# Patient Record
Sex: Male | Born: 1990 | Race: White | Hispanic: No | Marital: Married | Smoking: Former smoker
Health system: Southern US, Community
[De-identification: ages and names within clinical notes are randomized; demographics above are authoritative.]

## PROBLEM LIST (undated history)

## (undated) DIAGNOSIS — T7840XA Allergy, unspecified, initial encounter: Secondary | ICD-10-CM

## (undated) DIAGNOSIS — G43909 Migraine, unspecified, not intractable, without status migrainosus: Secondary | ICD-10-CM

## (undated) DIAGNOSIS — F32A Depression, unspecified: Secondary | ICD-10-CM

## (undated) DIAGNOSIS — J45909 Unspecified asthma, uncomplicated: Secondary | ICD-10-CM

## (undated) DIAGNOSIS — F329 Major depressive disorder, single episode, unspecified: Secondary | ICD-10-CM

## (undated) HISTORY — PX: TONSILLECTOMY: SUR1361

## (undated) HISTORY — PX: SEPTOPLASTY: SUR1290

## (undated) HISTORY — DX: Migraine, unspecified, not intractable, without status migrainosus: G43.909

## (undated) HISTORY — DX: Depression, unspecified: F32.A

## (undated) HISTORY — DX: Allergy, unspecified, initial encounter: T78.40XA

## (undated) HISTORY — DX: Major depressive disorder, single episode, unspecified: F32.9

## (undated) HISTORY — DX: Unspecified asthma, uncomplicated: J45.909

---
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
## 2013-12-11 ENCOUNTER — Encounter (HOSPITAL_COMMUNITY): Payer: Self-pay | Admitting: Emergency Medicine

## 2013-12-11 ENCOUNTER — Emergency Department (HOSPITAL_COMMUNITY)
Admission: EM | Admit: 2013-12-11 | Discharge: 2013-12-12 | Disposition: A | Discharge status: Home / Self Care | Payer: BC Managed Care – PPO | Attending: Emergency Medicine | Admitting: Emergency Medicine

## 2013-12-11 DIAGNOSIS — F32A Depression, unspecified: Secondary | ICD-10-CM

## 2013-12-11 DIAGNOSIS — R45851 Suicidal ideations: Secondary | ICD-10-CM

## 2013-12-11 DIAGNOSIS — Z79899 Other long term (current) drug therapy: Secondary | ICD-10-CM | POA: Insufficient documentation

## 2013-12-11 DIAGNOSIS — IMO0002 Reserved for concepts with insufficient information to code with codable children: Secondary | ICD-10-CM | POA: Insufficient documentation

## 2013-12-11 DIAGNOSIS — R454 Irritability and anger: Secondary | ICD-10-CM

## 2013-12-11 DIAGNOSIS — F911 Conduct disorder, childhood-onset type: Secondary | ICD-10-CM | POA: Insufficient documentation

## 2013-12-11 DIAGNOSIS — F3289 Other specified depressive episodes: Secondary | ICD-10-CM | POA: Insufficient documentation

## 2013-12-11 DIAGNOSIS — F329 Major depressive disorder, single episode, unspecified: Secondary | ICD-10-CM

## 2013-12-11 DIAGNOSIS — F17200 Nicotine dependence, unspecified, uncomplicated: Secondary | ICD-10-CM | POA: Insufficient documentation

## 2013-12-11 DIAGNOSIS — G479 Sleep disorder, unspecified: Secondary | ICD-10-CM | POA: Insufficient documentation

## 2013-12-11 LAB — CBC
HCT: 45.2 % (ref 39.0–52.0)
HEMOGLOBIN: 16.6 g/dL (ref 13.0–17.0)
MCH: 31.8 pg (ref 26.0–34.0)
MCHC: 36.7 g/dL — ABNORMAL HIGH (ref 30.0–36.0)
MCV: 86.6 fL (ref 78.0–100.0)
PLATELETS: 330 10*3/uL (ref 150–400)
RBC: 5.22 MIL/uL (ref 4.22–5.81)
RDW: 12.7 % (ref 11.5–15.5)
WBC: 6.9 10*3/uL (ref 4.0–10.5)
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
## 2013-12-12 LAB — ETHANOL: Alcohol, Ethyl (B): <11 mg/dL (ref 0–11)

## 2013-12-12 LAB — COMPREHENSIVE METABOLIC PANEL
ALK PHOS: 48 U/L (ref 39–117)
ALT: 13 U/L (ref 0–53)
AST: 20 U/L (ref 0–37)
Albumin: 4.5 g/dL (ref 3.5–5.2)
BUN: 11 mg/dL (ref 6–23)
CALCIUM: 9.5 mg/dL (ref 8.4–10.5)
CHLORIDE: 102 meq/L (ref 96–112)
CO2: 27 meq/L (ref 19–32)
Creatinine, Ser: 1.2 mg/dL (ref 0.50–1.35)
GFR, EST NON AFRICAN AMERICAN: 86 mL/min — AB (ref >90)
GLUCOSE: 94 mg/dL (ref 70–99)
POTASSIUM: 4 meq/L (ref 3.7–5.3)
SODIUM: 142 meq/L (ref 137–147)
Total Bilirubin: 0.4 mg/dL (ref 0.3–1.2)
Total Protein: 7.3 g/dL (ref 6.0–8.3)

## 2013-12-12 LAB — SALICYLATE LEVEL

## 2013-12-12 LAB — RAPID URINE DRUG SCREEN, HOSP PERFORMED
AMPHETAMINES: NOT DETECTED
BENZODIAZEPINES: POSITIVE — AB
Barbiturates: NOT DETECTED
Cocaine: NOT DETECTED
Opiates: NOT DETECTED
TETRAHYDROCANNABINOL: POSITIVE — AB

## 2013-12-12 LAB — ACETAMINOPHEN LEVEL: Acetaminophen (Tylenol), Serum: <15 ug/mL (ref 10–30)

## 2013-12-12 MED ORDER — LORAZEPAM 1 MG PO TABS
1.0000 mg | ORAL_TABLET | Freq: Once | ORAL | Status: AC
  Administered 2013-12-12: 1 mg via ORAL
  Filled 2013-12-12: qty 1

## 2013-12-12 NOTE — ED Notes (Signed)
Behavioral Health No-harm contract signed by pt.

## 2013-12-12 NOTE — Discharge Instructions (Signed)
Suicidal Feelings, How to Help Yourself  You contracted for safety. If he had any recurrent thoughts of suicide you need to contact her therapist or return to the ED immediately for help.  Everyone feels sad or unhappy at times, but depressing thoughts and feelings of hopelessness can lead to thoughts of suicide. It can seem as if life is too tough to handle. If you feel as though you have reached the point where suicide is the only answer, it is time to let someone know immediately.  HOW TO COPE AND PREVENT SUICIDE  Let family, friends, teachers, or counselors know. Get help. Try not to isolate yourself from those who care about you. Even though you may not feel sociable, talk with someone every day. It is best if it is face-to-face. Remember, they will want to help you.  Eat a regularly spaced and well-balanced diet.  Get plenty of rest.  Avoid alcohol and drugs because they will only make you feel worse and may also lower your inhibitions. Remove them from the home. If you are thinking of taking an overdose of your prescribed medicines, give your medicines to someone who can give them to you one day at a time. If you are on antidepressants, let your caregiver know of your feelings so he or she can provide a safer medicine, if that is a concern.  Remove weapons or poisons from your home.  Try to stick to routines. Follow a schedule and remind yourself that you have to keep that schedule every day.  Set some realistic goals and achieve them. Make a list and cross things off as you go. Accomplishments give a sense of worth. Wait until you are feeling better before doing things you find difficult or unpleasant to do.  If you are able, try to start exercising. Even half-hour periods of exercise each day will make you feel better. Getting out in the sun or into nature helps you recover from depression faster. If you have a favorite place to walk, take advantage of that.  Increase safe activities  that have always given you pleasure. This may include playing your favorite music, reading a good book, painting a picture, or playing your favorite instrument. Do whatever takes your mind off your depression.  Keep your living space well-lighted. GET HELP Contact a suicide hotline, crisis center, or local suicide prevention center for help right away. Local centers may include a hospital, clinic, community service organization, social service provider, or health department.  Call your local emergency services (911 in the [place]).  Call a suicide hotline:  1-800-273-TALK (85780767211-9130344556) in the [place].  1-800-SUICIDE 207-771-2524(1-(847) 450-9001) in the [place].  82810721121-226-369-2933 in the [place] for Spanish-speaking counselors.  4-696-295-2WUX1-800-799-4TTY (575)097-8630(1-731-838-6225) in the [place] for TTY users.  Visit the following websites for information and help:  National Suicide Prevention Lifeline: www.suicidepreventionlifeline.org  Hopeline: www.hopeline.com  [employer] for Suicide Prevention: https://www.ayers.com/www.afsp.org  For lesbian, gay, bisexual, transgender, or questioning youth, contact [employer]:  3-664-4-I-HKVQQV1-866-4-U-TREVOR 318 525 7399(1-406-540-8870) in the [place].  www.thetrevorproject.org  In [place], treatment resources are listed in each province with listings available under [employer] or similar titles. Another source for Crisis Centres by [place] is located at http://www.suicideprevention.ca/in-crisis-now/find-a-crisis-centre-now/crisis-centres Document Released: 04/09/2003 Document Revised: 12/26/2011 Document Reviewed: 08/28/2007 [facility] Patient Information 2014 [place], [place]. Depression, Adult Depression refers to feeling sad, low, down in the dumps, blue, gloomy, or empty. In general, there are two kinds of depression: 1. Depression that we all  experience from time to time because of upsetting life experiences, including the loss of a job or the  ending of a relationship (normal sadness or normal grief). This kind of depression is considered normal, is short lived, and resolves within a few days to 2 weeks. (Depression experienced after the loss of a loved one is called bereavement. Bereavement often lasts longer than 2 weeks but normally gets better with time.) 2. Clinical depression, which lasts longer than normal sadness or normal grief or interferes with your ability to function at home, at work, and in school. It also interferes with your personal relationships. It affects almost every aspect of your life. Clinical depression is an illness. Symptoms of depression also can be caused by conditions other than normal sadness and grief or clinical depression. Examples of these conditions are listed as follows:  Physical illness Some physical illnesses, including underactive thyroid gland (hypothyroidism), severe anemia, specific types of cancer, diabetes, uncontrolled seizures, heart and lung problems, strokes, and chronic pain are commonly associated with symptoms of depression.  Side effects of some prescription medicine In some people, certain types of prescription medicine can cause symptoms of depression.  Substance abuse Abuse of alcohol and illicit drugs can cause symptoms of depression. SYMPTOMS Symptoms of normal sadness and normal grief include the following:  Feeling sad or crying for short periods of time.  Not caring about anything (apathy).  Difficulty sleeping or sleeping too much.  No longer able to enjoy the things you used to enjoy.  Desire to be by oneself all the time (social isolation).  Lack of energy or motivation.  Difficulty concentrating or remembering.  Change in appetite or weight.  Restlessness or agitation. Symptoms of clinical depression include the same symptoms of normal sadness or normal grief and also the following symptoms:  Feeling sad or crying all the time.  Feelings of guilt or  worthlessness.  Feelings of hopelessness or helplessness.  Thoughts of suicide or the desire to harm yourself (suicidal ideation).  Loss of touch with reality (psychotic symptoms). Seeing or hearing things that are not real (hallucinations) or having false beliefs about your life or the people around you (delusions and paranoia). DIAGNOSIS  The diagnosis of clinical depression usually is based on the severity and duration of the symptoms. Your caregiver also will ask you questions about your medical history and substance use to find out if physical illness, use of prescription medicine, or substance abuse is causing your depression. Your caregiver also may order blood tests. TREATMENT  Typically, normal sadness and normal grief do not require treatment. However, sometimes antidepressant medicine is prescribed for bereavement to ease the depressive symptoms until they resolve. The treatment for clinical depression depends on the severity of your symptoms but typically includes antidepressant medicine, counseling with a mental health professional, or a combination of both. Your caregiver will help to determine what treatment is best for you. Depression caused by physical illness usually goes away with appropriate medical treatment of the illness. If prescription medicine is causing depression, talk with your caregiver about stopping the medicine, decreasing the dose, or substituting another medicine. Depression caused by abuse of alcohol or illicit drugs abuse goes away with abstinence from these substances. Some adults need professional help in order to stop drinking or using drugs. SEEK IMMEDIATE CARE IF:  You have thoughts about hurting yourself or others.  You lose touch with reality (have psychotic symptoms).  You are taking medicine for depression and have a serious side effect.  FOR MORE INFORMATION National Alliance on Mental Illness: www.nami.[employer] of Mental Health:  http://www.maynard.net/ Document Released: 09/30/2000 Document Revised: 04/03/2012 Document Reviewed: 01/02/2012 [facility] Patient Information 2014 [place], [place].

## 2014-04-20 ENCOUNTER — Emergency Department (HOSPITAL_BASED_OUTPATIENT_CLINIC_OR_DEPARTMENT_OTHER)
Admission: EM | Admit: 2014-04-20 | Discharge: 2014-04-21 | Disposition: A | Discharge status: Home / Self Care | Payer: BC Managed Care – PPO | Attending: Emergency Medicine | Admitting: Emergency Medicine

## 2014-04-20 ENCOUNTER — Encounter (HOSPITAL_BASED_OUTPATIENT_CLINIC_OR_DEPARTMENT_OTHER): Payer: Self-pay | Admitting: Emergency Medicine

## 2014-04-20 DIAGNOSIS — S91309A Unspecified open wound, unspecified foot, initial encounter: Secondary | ICD-10-CM | POA: Insufficient documentation

## 2014-04-20 DIAGNOSIS — Y9289 Other specified places as the place of occurrence of the external cause: Secondary | ICD-10-CM | POA: Insufficient documentation

## 2014-04-20 DIAGNOSIS — W268XXA Contact with other sharp object(s), not elsewhere classified, initial encounter: Secondary | ICD-10-CM | POA: Insufficient documentation

## 2014-04-20 DIAGNOSIS — F17200 Nicotine dependence, unspecified, uncomplicated: Secondary | ICD-10-CM | POA: Insufficient documentation

## 2014-04-20 DIAGNOSIS — Y9389 Activity, other specified: Secondary | ICD-10-CM | POA: Insufficient documentation

## 2014-04-20 DIAGNOSIS — IMO0002 Reserved for concepts with insufficient information to code with codable children: Secondary | ICD-10-CM | POA: Insufficient documentation

## 2014-04-20 DIAGNOSIS — Z79899 Other long term (current) drug therapy: Secondary | ICD-10-CM | POA: Insufficient documentation

## 2014-04-20 MED ORDER — SULFAMETHOXAZOLE-TRIMETHOPRIM 800-160 MG PO TABS: 1.0000 | ORAL_TABLET | Freq: Two times a day (BID) | ORAL | Status: DC

## 2014-04-20 MED ORDER — HYDROCODONE-ACETAMINOPHEN 5-325 MG PO TABS: 1.0000 | ORAL_TABLET | Freq: Four times a day (QID) | ORAL | Status: DC | PRN

## 2014-04-20 NOTE — ED Notes (Signed)
[place] and has a laceration to bottom side of left foot. Bleeding controlled at triage. Last tetanus approx. 3 years ago.

## 2014-04-20 NOTE — ED Provider Notes (Signed)
CSN: [id number]     Arrival date & time 04/20/14  1942 History   First MD Initiated Contact with Patient 04/20/14 2303   This chart was scribed for Somya Jauregui Smitty CordsK Analyah Mcconnon-Rasch, MD by Gwenevere AbbotAlexis Brown, ED scribe. This patient was seen in room MH09/MH09 and the patient's care was started at 11:06 PM.    Chief Complaint  Patient presents with  . Laceration      Laceration  The incident occurred 2 days ago. The laceration is located on the right foot. The laceration mechanism was a metal edge. The pain is moderate. The pain has been constant since onset. He reports no foreign bodies present. His tetanus status is UTD.  Patient is a 23 y.o. male presenting with skin laceration. The history is provided by the patient.  Laceration Location:  Foot Foot laceration location:  Sole of L foot Depth:  Cutaneous Bleeding: controlled   Time since incident:  1 day Laceration mechanism:  Metal edge Pain details:    Quality:  Aching   Severity:  Moderate   Timing:  Constant   Progression:  Unchanged Foreign body present:  No foreign bodies Relieved by:  Nothing Worsened by:  Nothing tried Ineffective treatments:  None tried Tetanus status:  UTD HPI Comments:  Serita Butcherdam D Hornig is a 23 y.o. male who presents to the Emergency Department complaining of a laceration to the right foot. Kicked a dock, no flip flop > 24 hours ago   History reviewed. No pertinent past medical history. Past Surgical History  Procedure Laterality Date  . Tonsillectomy    . Septoplasty     History reviewed. No pertinent family history. History  Substance Use Topics  . Smoking status: Current Every Day Smoker -- 0.50 packs/day    Types: Cigarettes  . Smokeless tobacco: Never Used  . Alcohol Use: 3.6 oz/week    6 Shots of liquor per week    Review of Systems  Skin: Positive for wound.  All other systems reviewed and are negative.     Allergies  Nsaids  Home Medications   Prior to Admission medications   Medication  Sig Start Date End Date Taking? Authorizing Provider  acetaminophen (TYLENOL) 325 MG tablet Take 650 mg by mouth every 6 (six) hours as needed. For pain    Yes Historical Provider, MD  escitalopram (LEXAPRO) 10 MG tablet Take 10 mg by mouth daily.  12/11/13   Historical Provider, MD  temazepam (RESTORIL) 15 MG capsule Take 15 mg by mouth at bedtime as needed for sleep.  12/11/13   Historical Provider, MD   BP 137/73  Pulse 94  Temp(Src) 98.5 F (36.9 C) (Oral)  Resp 21  Ht 5\' 8"  (1.727 m)  Wt 160 lb (72.576 kg)  BMI 24.33 kg/m2  SpO2 98% Physical Exam  Nursing note and vitals reviewed. Constitutional: He is oriented to person, place, and time. He appears well-developed and well-nourished. No distress.  HENT:  Head: Normocephalic and atraumatic.  Mouth/Throat: Oropharynx is clear and moist.  Eyes: EOM are normal. Pupils are equal, round, and reactive to light.  Neck: Normal range of motion. Neck supple.  Cardiovascular: Normal rate, regular rhythm and normal heart sounds.  Exam reveals no gallop and no friction rub.   No murmur heard. Pulmonary/Chest: Effort normal and breath sounds normal. He has no wheezes. He has no rales.  Cap Refill Less than 2 seconds  Abdominal: Soft. Bowel sounds are normal. There is no tenderness. There is no rebound and no  guarding.  Musculoskeletal: Normal range of motion. He exhibits no edema.       Left foot: He exhibits laceration. He exhibits no crepitus and no deformity.  No tendon injuries   Neurological: He is alert and oriented to person, place, and time.  Skin: Skin is warm and dry.  Laceration "U'" shaped. Laceration cannot be repaired because it is more than 24 hours old. No flip flop involved.  Psychiatric: He has a normal mood and affect. His behavior is normal.    ED Course  Procedures  DIAGNOSTIC STUDIES: Oxygen Saturation is 98% on RA, normal by my interpretation.  COORDINATION OF CARE: 11:10 PM-Discussed treatment plan with pt at  bedside and pt agreed to plan.    EKG Interpretation None      MDM   Final diagnoses:  None  No indication for imaging at this time  Unable to close wound as it is over 24 hours.  No flip flop involved.  No foreign body or tendon injury.  Wound cleansed and bandaged.  Pain medication and antibiotics return if discharge redness streaking or swelling or any concerns.     I personally performed the services described in this documentation, which was scribed in my presence. The recorded information has been reviewed and is accurate.       Jasmine AweApril K Raynell Upton-Rasch, MD 04/21/14 [phone]

## 2014-04-21 ENCOUNTER — Encounter (HOSPITAL_BASED_OUTPATIENT_CLINIC_OR_DEPARTMENT_OTHER): Payer: Self-pay | Admitting: Emergency Medicine

## 2016-02-14 ENCOUNTER — Emergency Department (HOSPITAL_BASED_OUTPATIENT_CLINIC_OR_DEPARTMENT_OTHER)
Admission: EM | Admit: 2016-02-14 | Discharge: 2016-02-14 | Disposition: A | Discharge status: Home / Self Care | Payer: BLUE CROSS/BLUE SHIELD | Attending: Emergency Medicine | Admitting: Emergency Medicine

## 2016-02-14 ENCOUNTER — Emergency Department (HOSPITAL_BASED_OUTPATIENT_CLINIC_OR_DEPARTMENT_OTHER): Payer: BLUE CROSS/BLUE SHIELD

## 2016-02-14 ENCOUNTER — Encounter (HOSPITAL_BASED_OUTPATIENT_CLINIC_OR_DEPARTMENT_OTHER): Payer: Self-pay | Admitting: *Deleted

## 2016-02-14 DIAGNOSIS — Y929 Unspecified place or not applicable: Secondary | ICD-10-CM | POA: Diagnosis not present

## 2016-02-14 DIAGNOSIS — S93401A Sprain of unspecified ligament of right ankle, initial encounter: Secondary | ICD-10-CM | POA: Diagnosis not present

## 2016-02-14 DIAGNOSIS — F17210 Nicotine dependence, cigarettes, uncomplicated: Secondary | ICD-10-CM | POA: Diagnosis not present

## 2016-02-14 DIAGNOSIS — W1789XA Other fall from one level to another, initial encounter: Secondary | ICD-10-CM | POA: Diagnosis not present

## 2016-02-14 DIAGNOSIS — Y9344 Activity, trampolining: Secondary | ICD-10-CM | POA: Diagnosis not present

## 2016-02-14 DIAGNOSIS — M25571 Pain in right ankle and joints of right foot: Secondary | ICD-10-CM | POA: Diagnosis present

## 2016-02-14 DIAGNOSIS — Y999 Unspecified external cause status: Secondary | ICD-10-CM | POA: Diagnosis not present

## 2016-02-14 NOTE — ED Notes (Signed)
Pt states was on a trampoline, fell through trampoline and landed on ground onto rt foot, was able to walk, then pain increased, unable to place wt on rt foot at this time.

## 2016-02-14 NOTE — ED Notes (Signed)
MD at bedside. 

## 2016-02-14 NOTE — ED Provider Notes (Signed)
CSN: [id number]     Arrival date & time 02/14/16  1632 History  By signing my name below, I, [facility], attest that this documentation has been prepared under the direction and in the presence of Pricilla Loveless, MD. Electronically Signed: Randell Vazquez, ED Scribe. 02/14/2016. 6:26 PM.   Chief Complaint  Vazquez presents with  . Ankle Pain   The history is provided by the Vazquez. No language interpreter was used.   HPI Comments: Cory Vazquez is a 25 y.o. male who presents to the Emergency Department complaining of constant, mild right ankle pain onset 3 hours ago after an injury. Vazquez states that he was jumping on a trampoline when he broke through it upon landing, striking his right foot on the ground and followed immediately by pain. He reports associated right foot pain and swelling of his right ankle but he is able to ambulate without difficulty despite pain. Pain worse with bearing weight and ambulation. Denies any other symptoms currently.  History reviewed. No pertinent past medical history. Past Surgical History  Procedure Laterality Date  . Tonsillectomy    . Septoplasty     No family history on file. Social History  Substance Use Topics  . Smoking status: Current Every Day Smoker -- 0.50 packs/day    Types: Cigarettes  . Smokeless tobacco: Never Used  . Alcohol Use: 3.6 oz/week    6 Shots of liquor per week    Review of Systems  Musculoskeletal: Positive for myalgias, joint swelling and arthralgias.  All other systems reviewed and are negative.  Allergies  Nsaids  Home Medications   Prior to Admission medications   Medication Sig Start Date End Date Taking? Authorizing Provider  acetaminophen (TYLENOL) 325 MG tablet Take 650 mg by mouth every 6 (six) hours as needed. For pain     Historical Provider, MD  escitalopram (LEXAPRO) 10 MG tablet Take 10 mg by mouth daily.  12/11/13   Historical Provider, MD  HYDROcodone-acetaminophen (NORCO) 5-325 MG per  tablet Take 1 tablet by mouth every 6 (six) hours as needed. 04/20/14   April Palumbo, MD  sulfamethoxazole-trimethoprim (SEPTRA DS) 800-160 MG per tablet Take 1 tablet by mouth every 12 (twelve) hours. 04/20/14   April Palumbo, MD  temazepam (RESTORIL) 15 MG capsule Take 15 mg by mouth at bedtime as needed for sleep.  12/11/13   Historical Provider, MD   BP 146/97 mmHg  Pulse 89  Temp(Src) 98 F (36.7 C) (Oral)  Resp 16  Ht  (1.727 m)  Wt 195 lb (88.451 kg)  BMI 29.66 kg/m2  SpO2 97% Physical Exam  Constitutional: He is oriented to person, place, and time. He appears well-developed and well-nourished.  HENT:  Head: Normocephalic and atraumatic.  Right Ear: External ear normal.  Left Ear: External ear normal.  Nose: Nose normal.  Eyes: Right eye exhibits no discharge. Left eye exhibits no discharge.  Neck: Neck supple.  Cardiovascular: Normal rate, regular rhythm, normal heart sounds and intact distal pulses.   Pulses:      Dorsalis pedis pulses are 2+ on the right side.  Pulmonary/Chest: Effort normal and breath sounds normal.  Abdominal: Soft. There is no tenderness.  Musculoskeletal: He exhibits no edema.       Right ankle: He exhibits decreased range of motion and swelling (lateral). Tenderness. Lateral malleolus tenderness found. Achilles tendon exhibits no pain and no defect.       Right foot: There is no tenderness and no swelling.  Neurological:  He is alert and oriented to person, place, and time.  Skin: Skin is warm and dry.  Nursing note and vitals reviewed.   ED Course  Procedures   DIAGNOSTIC STUDIES: Oxygen Saturation is 97% on RA, normal by my interpretation.    COORDINATION OF CARE: 6:06 PM Will review right foot x-ray and return to discuss results. Discussed treatment plan with pt at bedside and pt agreed to plan.  Imaging Review Dg Foot Complete Right  02/14/2016  CLINICAL DATA:  Right foot pain, trampoline injury EXAM: RIGHT FOOT COMPLETE - 3+ VIEW  COMPARISON:  None. FINDINGS: No fracture or dislocation is seen. The joint spaces are preserved. Visualized soft tissues are within normal limits. IMPRESSION: No fracture or dislocation is seen. Electronically Signed   By: Charline BillsSriyesh  Krishnan M.D.   On: 02/14/2016 17:01   I have personally reviewed and evaluated these images as part of my medical decision-making.   MDM   Final diagnoses:  Right ankle sprain, initial encounter    Vazquez has an uncomplicated ankle sprain. Placed in cam-walker for comfort, feels better. Allergic to NSAIDs and states "tylenol doesn't work for me". Asking for narcotics. However I do not think narcotics are warranted for an ankle sprain that does not appear severe and that he has been able to bear weight on. Ice, tylenol and compression. F/u with PCP  I personally performed the services described in this documentation, which was scribed in my presence. The recorded information has been reviewed and is accurate.    Pricilla LovelessScott Abdulmalik Darco, MD 02/15/16 [phone]

## 2016-02-14 NOTE — ED Notes (Signed)
Due to pain unable to fully demonstrate plantar or dorsal flexion of rt foot

## 2016-02-14 NOTE — ED Notes (Signed)
Ice pack and elevation initiated for pt comfort 

## 2016-02-14 NOTE — ED Notes (Signed)
Pt reports he was jumping on a trampoline today and his right foot went through it. C/o pain in the right ankle.

## 2018-01-10 ENCOUNTER — Ambulatory Visit: Payer: BLUE CROSS/BLUE SHIELD | Admitting: Adult Health

## 2018-01-10 DIAGNOSIS — Z0289 Encounter for other administrative examinations: Secondary | ICD-10-CM

## 2018-01-10 NOTE — Progress Notes (Deleted)
Patient presents to clinic today to establish care. He is a pleasant 27 year old male who  has no past medical history on file.   Acute Concerns: Establish Care   Chronic Issues:   Health Maintenance: Dental -- Vision -- Immunizations -- Colonoscopy -- Diet Exercise   No past medical history on file.  Past Surgical History:  Procedure Laterality Date  . SEPTOPLASTY    . TONSILLECTOMY      Current Outpatient Medications on File Prior to Visit  Medication Sig Dispense Refill  . acetaminophen (TYLENOL) 325 MG tablet Take 650 mg by mouth every 6 (six) hours as needed. For pain     . escitalopram (LEXAPRO) 10 MG tablet Take 10 mg by mouth daily.     Marland Kitchen. HYDROcodone-acetaminophen (NORCO) 5-325 MG per tablet Take 1 tablet by mouth every 6 (six) hours as needed. 10 tablet 0  . sulfamethoxazole-trimethoprim (SEPTRA DS) 800-160 MG per tablet Take 1 tablet by mouth every 12 (twelve) hours. 14 tablet 0  . temazepam (RESTORIL) 15 MG capsule Take 15 mg by mouth at bedtime as needed for sleep.      No current facility-administered medications on file prior to visit.     Allergies  Allergen Reactions  . Nsaids Anaphylaxis    No family history on file.  Social History   Socioeconomic History  . Marital status: Single    Spouse name: Not on file  . Number of children: Not on file  . Years of education: Not on file  . Highest education level: Not on file  Occupational History  . Not on file  Social Needs  . Financial resource strain: Not on file  . Food insecurity:    Worry: Not on file    Inability: Not on file  . Transportation needs:    Medical: Not on file    Non-medical: Not on file  Tobacco Use  . Smoking status: Current Every Day Smoker    Packs/day: 0.50    Types: Cigarettes  . Smokeless tobacco: Never Used  Substance and Sexual Activity  . Alcohol use: Yes    Alcohol/week: 3.6 oz    Types: 6 Shots of liquor per week  . Drug use: No    Types: Marijuana   . Sexual activity: Yes  Lifestyle  . Physical activity:    Days per week: Not on file    Minutes per session: Not on file  . Stress: Not on file  Relationships  . Social connections:    Talks on phone: Not on file    Gets together: Not on file    Attends religious service: Not on file    Active member of club or organization: Not on file    Attends meetings of clubs or organizations: Not on file    Relationship status: Not on file  . Intimate partner violence:    Fear of current or ex partner: Not on file    Emotionally abused: Not on file    Physically abused: Not on file    Forced sexual activity: Not on file  Other Topics Concern  . Not on file  Social History Narrative  . Not on file    Review of Systems  Constitutional: Negative.   HENT: Negative.   Eyes: Negative.   Respiratory: Negative.   Cardiovascular: Negative.   Gastrointestinal: Negative.   Genitourinary: Negative.   Musculoskeletal: Negative.   Skin: Negative.   Endo/Heme/Allergies: Negative.   Psychiatric/Behavioral: Negative.  There were no vitals taken for this visit.  Physical Exam  Constitutional: He is oriented to person, place, and time and well-developed, well-nourished, and in no distress. No distress.  HENT:  Head: Normocephalic and atraumatic.  Right Ear: External ear normal.  Left Ear: External ear normal.  Nose: Nose normal.  Mouth/Throat: Oropharynx is clear and moist. No oropharyngeal exudate.  Eyes: Pupils are equal, round, and reactive to light. Conjunctivae and EOM are normal. Right eye exhibits no discharge. Left eye exhibits no discharge. No scleral icterus.  Neck: Normal range of motion. Neck supple. No JVD present. No tracheal deviation present. No thyromegaly present.  Cardiovascular: Normal rate, regular rhythm, normal heart sounds and intact distal pulses. Exam reveals no gallop and no friction rub.  No murmur heard. Pulmonary/Chest: Effort normal and breath sounds  normal. No stridor. No respiratory distress. He has no wheezes. He has no rales. He exhibits no tenderness.  Abdominal: Soft. Bowel sounds are normal. He exhibits no distension and no mass. There is no tenderness. There is no rebound and no guarding.  Musculoskeletal: Normal range of motion. He exhibits no edema, tenderness or deformity.  Lymphadenopathy:    He has no cervical adenopathy.  Neurological: He is alert and oriented to person, place, and time. He displays normal reflexes. No cranial nerve deficit. He exhibits normal muscle tone. Coordination normal. GCS score is 15.  Skin: Skin is warm and dry. No rash noted. He is not diaphoretic. No erythema. No pallor.  Psychiatric: Mood, memory, affect and judgment normal.  Nursing note and vitals reviewed.  Assessment/Plan: No problem-specific Assessment & Plan notes found for this encounter.

## 2018-02-21 ENCOUNTER — Ambulatory Visit: Payer: Self-pay | Admitting: Adult Health

## 2018-03-15 ENCOUNTER — Encounter (HOSPITAL_BASED_OUTPATIENT_CLINIC_OR_DEPARTMENT_OTHER): Payer: Self-pay

## 2018-03-15 ENCOUNTER — Emergency Department (HOSPITAL_BASED_OUTPATIENT_CLINIC_OR_DEPARTMENT_OTHER): Payer: Self-pay

## 2018-03-15 ENCOUNTER — Other Ambulatory Visit: Payer: Self-pay

## 2018-03-15 ENCOUNTER — Emergency Department (HOSPITAL_BASED_OUTPATIENT_CLINIC_OR_DEPARTMENT_OTHER)
Admission: EM | Admit: 2018-03-15 | Discharge: 2018-03-15 | Disposition: A | Discharge status: Home / Self Care | Payer: Self-pay | Attending: Emergency Medicine | Admitting: Emergency Medicine

## 2018-03-15 DIAGNOSIS — R0789 Other chest pain: Secondary | ICD-10-CM | POA: Insufficient documentation

## 2018-03-15 DIAGNOSIS — W2209XA Striking against other stationary object, initial encounter: Secondary | ICD-10-CM | POA: Insufficient documentation

## 2018-03-15 DIAGNOSIS — Y92019 Unspecified place in single-family (private) house as the place of occurrence of the external cause: Secondary | ICD-10-CM | POA: Insufficient documentation

## 2018-03-15 DIAGNOSIS — Y998 Other external cause status: Secondary | ICD-10-CM | POA: Insufficient documentation

## 2018-03-15 DIAGNOSIS — S62306A Unspecified fracture of fifth metacarpal bone, right hand, initial encounter for closed fracture: Secondary | ICD-10-CM | POA: Insufficient documentation

## 2018-03-15 DIAGNOSIS — F17210 Nicotine dependence, cigarettes, uncomplicated: Secondary | ICD-10-CM | POA: Insufficient documentation

## 2018-03-15 DIAGNOSIS — Y9389 Activity, other specified: Secondary | ICD-10-CM | POA: Insufficient documentation

## 2018-03-15 DIAGNOSIS — S62339A Displaced fracture of neck of unspecified metacarpal bone, initial encounter for closed fracture: Secondary | ICD-10-CM

## 2018-03-15 MED ORDER — TRAMADOL HCL 50 MG PO TABS: 50.0000 mg | ORAL_TABLET | Freq: Four times a day (QID) | ORAL | 0 refills | Status: DC | PRN

## 2018-03-15 MED ORDER — TRAMADOL HCL 50 MG PO TABS
50.0000 mg | ORAL_TABLET | Freq: Once | ORAL | Status: AC
  Administered 2018-03-15: 50 mg via ORAL
  Filled 2018-03-15: qty 1
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
## 2018-03-15 NOTE — Discharge Instructions (Signed)
You were seen in the ED today with a hand fracture. Take tramadol as needed for pain. Call the hand surgeon tomorrow to schedule an appointment for next week.

## 2018-03-28 ENCOUNTER — Ambulatory Visit: Payer: Self-pay | Admitting: Adult Health

## 2018-06-07 ENCOUNTER — Ambulatory Visit (INDEPENDENT_AMBULATORY_CARE_PROVIDER_SITE_OTHER): Payer: 59 | Admitting: Adult Health

## 2018-06-07 ENCOUNTER — Encounter: Payer: Self-pay | Admitting: Adult Health

## 2018-06-07 VITALS — BP 120/76 | HR 80 | Temp 98.1°F | Ht 68.5 in | Wt 183.0 lb

## 2018-06-07 DIAGNOSIS — Z3009 Encounter for other general counseling and advice on contraception: Secondary | ICD-10-CM

## 2018-06-07 DIAGNOSIS — Z0000 Encounter for general adult medical examination without abnormal findings: Secondary | ICD-10-CM

## 2018-06-07 DIAGNOSIS — Z23 Encounter for immunization: Secondary | ICD-10-CM | POA: Diagnosis not present

## 2018-06-07 DIAGNOSIS — F410 Panic disorder [episodic paroxysmal anxiety] without agoraphobia: Secondary | ICD-10-CM

## 2018-06-07 DIAGNOSIS — G4730 Sleep apnea, unspecified: Secondary | ICD-10-CM

## 2018-06-07 LAB — LIPID PANEL
Cholesterol: 139 mg/dL (ref 0–200)
HDL: 50.1 mg/dL (ref >39.00)
LDL Cholesterol: 79 mg/dL (ref 0–99)
NONHDL: 88.77
Total CHOL/HDL Ratio: 3
Triglycerides: 51 mg/dL (ref 0.0–149.0)
VLDL: 10.2 mg/dL (ref 0.0–40.0)

## 2018-06-07 LAB — BASIC METABOLIC PANEL
BUN: 15 mg/dL (ref 6–23)
CALCIUM: 9.9 mg/dL (ref 8.4–10.5)
CO2: 28 mEq/L (ref 19–32)
CREATININE: 1.38 mg/dL (ref 0.40–1.50)
Chloride: 105 mEq/L (ref 96–112)
GFR: 66.56 mL/min (ref >60.00)
Glucose, Bld: 100 mg/dL — ABNORMAL HIGH (ref 70–99)
Potassium: 4.3 mEq/L (ref 3.5–5.1)
SODIUM: 140 meq/L (ref 135–145)

## 2018-06-07 LAB — CBC WITH DIFFERENTIAL/PLATELET
BASOS ABS: 0 10*3/uL (ref 0.0–0.1)
Basophils Relative: 1.2 % (ref 0.0–3.0)
EOS ABS: 0.1 10*3/uL (ref 0.0–0.7)
Eosinophils Relative: 3 % (ref 0.0–5.0)
HEMATOCRIT: 46 % (ref 39.0–52.0)
HEMOGLOBIN: 16.1 g/dL (ref 13.0–17.0)
Lymphocytes Relative: 29.7 % (ref 12.0–46.0)
Lymphs Abs: 1.2 10*3/uL (ref 0.7–4.0)
MCHC: 35 g/dL (ref 30.0–36.0)
MCV: 88.3 fl (ref 78.0–100.0)
Monocytes Absolute: 0.4 10*3/uL (ref 0.1–1.0)
Monocytes Relative: 8.7 % (ref 3.0–12.0)
Neutro Abs: 2.4 10*3/uL (ref 1.4–7.7)
Neutrophils Relative %: 57.4 % (ref 43.0–77.0)
Platelets: 282 10*3/uL (ref 150.0–400.0)
RBC: 5.21 Mil/uL (ref 4.22–5.81)
RDW: 13 % (ref 11.5–15.5)
WBC: 4.1 10*3/uL (ref 4.0–10.5)

## 2018-06-07 LAB — HEPATIC FUNCTION PANEL
ALK PHOS: 52 U/L (ref 39–117)
ALT: 14 U/L (ref 0–53)
AST: 15 U/L (ref 0–37)
Albumin: 5 g/dL (ref 3.5–5.2)
Bilirubin, Direct: 0.2 mg/dL (ref 0.0–0.3)
TOTAL PROTEIN: 7.4 g/dL (ref 6.0–8.3)
Total Bilirubin: 1.1 mg/dL (ref 0.2–1.2)

## 2018-06-07 LAB — TSH: TSH: 0.67 u[IU]/mL (ref 0.35–4.50)

## 2018-06-07 MED ORDER — ALPRAZOLAM 0.25 MG PO TABS: 0.2500 mg | ORAL_TABLET | Freq: Two times a day (BID) | ORAL | 2 refills | Status: DC | PRN

## 2018-06-07 NOTE — Progress Notes (Signed)
Patient presents to clinic today to establish care. He is a pleasant 27 year old male who  has a past medical history of Depression.  He is a former patient of Dr. Kirby FunkJohn Griffin [facility]   Acute Concerns: Establish Care  Sleep Apnea - Snores. Has never been formally diagnosed. Has been using his brothers CPAP and notices improvement in sleep quality. His wife does not report episodes of gasping for breath.   Vasectomy - his daughter was recently diagnosed. He would like to be evaluated for vasectomy so that he does not have to worry about passing this on to another child.    Chronic Issues: Anxiety - daughter was diagnosed recently muscular dystrophy. He has had panic attacks related to this. He denies any depression   Health Maintenance: Dental -- Routine  Vision -- Routine  Immunizations -- Needs Tdap  Diet: Does not eat healthy  Exercise: Does not exercise   Past Medical History:  Diagnosis Date  . Depression     Past Surgical History:  Procedure Laterality Date  . SEPTOPLASTY    . TONSILLECTOMY      No current outpatient medications on file prior to visit.   No current facility-administered medications on file prior to visit.     Allergies  Allergen Reactions  . Nsaids Anaphylaxis    No family history on file.  Social History   Socioeconomic History  . Marital status: Single    Spouse name: Not on file  . Number of children: Not on file  . Years of education: Not on file  . Highest education level: Not on file  Occupational History  . Not on file  Social Needs  . Financial resource strain: Not on file  . Food insecurity:    Worry: Not on file    Inability: Not on file  . Transportation needs:    Medical: Not on file    Non-medical: Not on file  Tobacco Use  . Smoking status: Former Smoker    Packs/day: 0.50    Types: Cigarettes    Last attempt to quit: 06/08/2015    Years since quitting: 3.0  . Smokeless tobacco: Never Used    Substance and Sexual Activity  . Alcohol use: Yes    Alcohol/week: 6.0 standard drinks    Types: 6 Shots of liquor per week  . Drug use: No    Types: Marijuana  . Sexual activity: Yes  Lifestyle  . Physical activity:    Days per week: Not on file    Minutes per session: Not on file  . Stress: Not on file  Relationships  . Social connections:    Talks on phone: Not on file    Gets together: Not on file    Attends religious service: Not on file    Active member of club or organization: Not on file    Attends meetings of clubs or organizations: Not on file    Relationship status: Not on file  . Intimate partner violence:    Fear of current or ex partner: Not on file    Emotionally abused: Not on file    Physically abused: Not on file    Forced sexual activity: Not on file  Other Topics Concern  . Not on file  Social History Narrative  . Not on file    Review of Systems  Constitutional: Negative.   HENT: Negative.   Eyes: Negative.   Respiratory: Negative.   Cardiovascular: Negative.  Gastrointestinal: Negative.   Genitourinary: Negative.   Musculoskeletal: Negative.   Skin: Negative.   Neurological: Negative.   Endo/Heme/Allergies: Negative.   Psychiatric/Behavioral: Negative.   All other systems reviewed and are negative.      BP 120/76 (BP Location: Right Arm, Patient Position: Sitting, Cuff Size: Normal)   Pulse 80   Temp 98.1 F (36.7 C) (Oral)   Ht 5' 8.5" (1.74 m)   Wt 183 lb (83 kg)   SpO2 99%   BMI 27.42 kg/m   Physical Exam  Constitutional: He is oriented to person, place, and time. He appears well-developed and well-nourished. No distress.  HENT:  Head: Normocephalic and atraumatic.  Right Ear: External ear normal.  Left Ear: External ear normal.  Nose: Nose normal.  Mouth/Throat: Oropharynx is clear and moist. No oropharyngeal exudate.  Eyes: Pupils are equal, round, and reactive to light. Conjunctivae and EOM are normal. Right eye  exhibits no discharge. Left eye exhibits no discharge. No scleral icterus.  Neck: Normal range of motion. Neck supple. No JVD present. No tracheal deviation present. No thyromegaly present.  Cardiovascular: Normal rate, regular rhythm, normal heart sounds and intact distal pulses. Exam reveals no gallop and no friction rub.  No murmur heard. Pulmonary/Chest: Effort normal and breath sounds normal. No stridor. No respiratory distress. He has no wheezes. He has no rales. He exhibits no tenderness.  Abdominal: Soft. Bowel sounds are normal. He exhibits no distension and no mass. There is no tenderness. There is no rebound and no guarding. No hernia.  Musculoskeletal: Normal range of motion. He exhibits no edema, tenderness or deformity.  Lymphadenopathy:    He has no cervical adenopathy.  Neurological: He is alert and oriented to person, place, and time. He displays normal reflexes. No cranial nerve deficit or sensory deficit. He exhibits normal muscle tone. Coordination normal.  Skin: Skin is warm and dry. Capillary refill takes less than 2 seconds. No rash noted. He is not diaphoretic. No erythema. No pallor.  Psychiatric: He has a normal mood and affect. His behavior is normal. Judgment and thought content normal.  Nursing note and vitals reviewed.  Assessment/Plan: 1. Routine general medical examination at a health care facility - Encouraged heart healthy diet and exercise - Basic metabolic panel - CBC with Differential/Platelet - Hepatic function panel - Lipid panel - TSH  2. Need for Tdap vaccination  - Tdap vaccine greater than or equal to 7yo IM  3. Sleep apnea, unspecified type  - Ambulatory referral to Pulmonology  4. Vasectomy evaluation  - Ambulatory referral to Urology  5. Anxiety attack  - ALPRAZolam (XANAX) 0.25 MG tablet; Take 1 tablet (0.25 mg total) by mouth 2 (two) times daily as needed for anxiety.  Dispense: 45 tablet; Refill: 2  Shirline Frees, NP

## 2018-07-03 ENCOUNTER — Institutional Professional Consult (permissible substitution): Payer: PRIVATE HEALTH INSURANCE | Admitting: Pulmonary Disease

## 2018-07-25 ENCOUNTER — Encounter: Payer: Self-pay | Admitting: Pulmonary Disease

## 2018-07-25 ENCOUNTER — Ambulatory Visit (INDEPENDENT_AMBULATORY_CARE_PROVIDER_SITE_OTHER): Payer: 59 | Admitting: Pulmonary Disease

## 2018-07-25 VITALS — BP 116/70 | HR 100 | Ht 68.0 in | Wt 184.0 lb

## 2018-07-25 DIAGNOSIS — R0683 Snoring: Secondary | ICD-10-CM | POA: Diagnosis not present

## 2018-07-25 DIAGNOSIS — G4719 Other hypersomnia: Secondary | ICD-10-CM

## 2018-07-25 NOTE — Progress Notes (Signed)
Subjective:    Patient ID: Cory Vazquez, male    DOB: 03-Mar-1991, 27 y.o.   MRN: [id number] Chief complaint: History of snoring, witnessed apneas  HPI: Long-standing history of snoring Has experience with CPAP use-sibling with OSA He was tolerating CPAP well He occasionally still snores Usually goes to bed between 830 and 11 PM Wakes up between 3-5 times during the night Gets out of bed at 6:30 in the morning  He is managing to lose weight recently about 30 pounds so far  He snores very loudly without CPAP, sleep is nonrestorative without CPAP  Has a history of deviated septum for which he had surgery in 20 07/2010 Does suffer from environmental allergies  Occasional headaches, occasional dryness of his mouth in the mornings  Past Medical History:  Diagnosis Date  . Allergy   . Asthma   . Depression    Social History   Socioeconomic History  . Marital status: Married    Spouse name: Not on file  . Number of children: 1  . Years of education: 42  . Highest education level: High school graduate  Occupational History  . Not on file  Social Needs  . Financial resource strain: Not on file  . Food insecurity:    Worry: Not on file    Inability: Not on file  . Transportation needs:    Medical: Not on file    Non-medical: Not on file  Tobacco Use  . Smoking status: Former Smoker    Packs/day: 0.50    Types: Cigarettes    Last attempt to quit: 06/08/2015    Years since quitting: 3.1  . Smokeless tobacco: Never Used  Substance and Sexual Activity  . Alcohol use: Yes    Alcohol/week: 6.0 standard drinks    Types: 6 Shots of liquor per week  . Drug use: No    Types: Marijuana  . Sexual activity: Yes  Lifestyle  . Physical activity:    Days per week: Not on file    Minutes per session: Not on file  . Stress: Not on file  Relationships  . Social connections:    Talks on phone: Not on file    Gets together: Not on file    Attends religious service: Not on  file    Active member of club or organization: Not on file    Attends meetings of clubs or organizations: Not on file    Relationship status: Not on file  . Intimate partner violence:    Fear of current or ex partner: Not on file    Emotionally abused: Not on file    Physically abused: Not on file    Forced sexual activity: Not on file  Other Topics Concern  . Not on file  Social History Narrative  . Not on file   Family History  Problem Relation Age of Onset  . Depression Mother   . Arthritis Maternal Grandmother   . Hypertension Maternal Grandmother   . Stroke Maternal Grandmother   . Hypertension Maternal Grandfather   . Diabetes Maternal Grandfather   . Arthritis Maternal Grandfather   . Cancer Paternal Grandmother   . Hearing loss Paternal Grandfather     Review of Systems  Constitutional: Positive for fatigue.  HENT: Negative.   Eyes: Negative.   Respiratory: Negative.   Cardiovascular: Negative.   Gastrointestinal: Negative.   Endocrine: Negative.   Genitourinary: Negative.   Musculoskeletal: Negative.   Skin: Negative.   Allergic/Immunologic: Negative.  Neurological: Negative.   Hematological: Negative.   Psychiatric/Behavioral: Positive for sleep disturbance.   Vitals:   07/25/18 1320  BP: 116/70  Pulse: 100  SpO2: 97%      Objective:   Physical Exam  Constitutional: He is oriented to person, place, and time. He appears well-developed and well-nourished. No distress.  HENT:  Head: Normocephalic and atraumatic.  Mouth/Throat: No oropharyngeal exudate.  Eyes: Pupils are equal, round, and reactive to light. EOM are normal. Right eye exhibits no discharge. Left eye exhibits no discharge.  Neck: Normal range of motion. Neck supple. No tracheal deviation present. No thyromegaly present.  Cardiovascular: Normal rate and regular rhythm.  No murmur heard. Pulmonary/Chest: Effort normal and breath sounds normal. No respiratory distress. He has no wheezes.  He exhibits no tenderness.  Abdominal: Bowel sounds are normal.  Musculoskeletal: Normal range of motion. He exhibits no edema.  Neurological: He is alert and oriented to person, place, and time.  Skin: Skin is warm and dry. He is not diaphoretic.   Epworth Sleepiness Scale of 9    Assessment & Plan:  .  High probability of significant sleep disordered breathing  .  Snoring and daytime sleepiness  Plan. .  We will order a home sleep study .  Treatment options include an auto titrating CPAP .  Pathophysiology of sleep disordered breathing discussed .  Treatment options of sleep disordered breathing discussed .  Importance of continuing his weight loss efforts discussed  Information material regarding sleep disordered breathing provided

## 2018-07-25 NOTE — Patient Instructions (Signed)
Patient with a history of snoring  Experience with using CPAP-symptoms are better with CPAP use  We will order a home sleep study  Treatment options will include an auto titrating CPAP  See you back in the office in about 2 to 3 months following initiation of treatment  Call with any significant concerns  osa Sleep Apnea Sleep apnea is a condition in which breathing pauses or becomes shallow during sleep. Episodes of sleep apnea usually last 10 seconds or longer, and they may occur as many as 20 times an hour. Sleep apnea disrupts your sleep and keeps your body from getting the rest that it needs. This condition can increase your risk of certain health problems, including:  Heart attack.  Stroke.  Obesity.  Diabetes.  Heart failure.  Irregular heartbeat.  There are three kinds of sleep apnea:  Obstructive sleep apnea. This kind is caused by a blocked or collapsed airway.  Central sleep apnea. This kind happens when the part of the brain that controls breathing does not send the correct signals to the muscles that control breathing.  Mixed sleep apnea. This is a combination of obstructive and central sleep apnea.  What are the causes? The most common cause of this condition is a collapsed or blocked airway. An airway can collapse or become blocked if:  Your throat muscles are abnormally relaxed.  Your tongue and tonsils are larger than normal.  You are overweight.  Your airway is smaller than normal.  What increases the risk? This condition is more likely to develop in people who:  Are overweight.  Smoke.  Have a smaller than normal airway.  Are elderly.  Are male.  Drink alcohol.  Take sedatives or tranquilizers.  Have a family history of sleep apnea.  What are the signs or symptoms? Symptoms of this condition include:  Trouble staying asleep.  Daytime sleepiness and tiredness.  Irritability.  Loud snoring.  Morning headaches.  Trouble  concentrating.  Forgetfulness.  Decreased interest in sex.  Unexplained sleepiness.  Mood swings.  Personality changes.  Feelings of depression.  Waking up often during the night to urinate.  Dry mouth.  Sore throat.  How is this diagnosed? This condition may be diagnosed with:  A medical history.  A physical exam.  A series of tests that are done while you are sleeping (sleep study). These tests are usually done in a sleep lab, but they may also be done at home.  How is this treated? Treatment for this condition aims to restore normal breathing and to ease symptoms during sleep. It may involve managing health issues that can affect breathing, such as high blood pressure or obesity. Treatment may include:  Sleeping on your side.  Using a decongestant if you have nasal congestion.  Avoiding the use of depressants, including alcohol, sedatives, and narcotics.  Losing weight if you are overweight.  Making changes to your diet.  Quitting smoking.  Using a device to open your airway while you sleep, such as: ? An oral appliance. This is a custom-made mouthpiece that shifts your lower jaw forward. ? A continuous positive airway pressure (CPAP) device. This device delivers oxygen to your airway through a mask. ? A nasal expiratory positive airway pressure (EPAP) device. This device has valves that you put into each nostril. ? A bi-level positive airway pressure (BPAP) device. This device delivers oxygen to your airway through a mask.  Surgery if other treatments do not work. During surgery, excess tissue is removed to  create a wider airway.  It is important to get treatment for sleep apnea. Without treatment, this condition can lead to:  High blood pressure.  Coronary artery disease.  (Men) An inability to achieve or maintain an erection (impotence).  Reduced thinking abilities.  Follow these instructions at home:  Make any lifestyle changes that your health  care provider recommends.  Eat a healthy, well-balanced diet.  Take over-the-counter and prescription medicines only as told by your health care provider.  Avoid using depressants, including alcohol, sedatives, and narcotics.  Take steps to lose weight if you are overweight.  If you were given a device to open your airway while you sleep, use it only as told by your health care provider.  Do not use any tobacco products, such as cigarettes, chewing tobacco, and e-cigarettes. If you need help quitting, ask your health care provider.  Keep all follow-up visits as told by your health care provider. This is important. Contact a health care provider if:  The device that you received to open your airway during sleep is uncomfortable or does not seem to be working.  Your symptoms do not improve.  Your symptoms get worse. Get help right away if:  You develop chest pain.  You develop shortness of breath.  You develop discomfort in your back, arms, or stomach.  You have trouble speaking.  You have weakness on one side of your body.  You have drooping in your face. These symptoms may represent a serious problem that is an emergency. Do not wait to see if the symptoms will go away. Get medical help right away. Call your local emergency services (911 in the [place] Do not drive yourself to the hospital. This information is not intended to replace advice given to you by your health care provider. Make sure you discuss any questions you have with your health care provider. Document Released: 09/23/2002 Document Revised: 05/29/2016 Document Reviewed: 07/13/2015 Elsevier Interactive Patient Education  [employer].

## 2018-08-06 DIAGNOSIS — G4733 Obstructive sleep apnea (adult) (pediatric): Secondary | ICD-10-CM

## 2018-08-07 ENCOUNTER — Other Ambulatory Visit: Payer: Self-pay | Admitting: *Deleted

## 2018-08-07 DIAGNOSIS — R0683 Snoring: Secondary | ICD-10-CM

## 2018-08-09 DIAGNOSIS — G4733 Obstructive sleep apnea (adult) (pediatric): Secondary | ICD-10-CM | POA: Diagnosis not present

## 2018-08-10 ENCOUNTER — Telehealth: Payer: Self-pay | Admitting: Pulmonary Disease

## 2018-08-10 DIAGNOSIS — R0683 Snoring: Secondary | ICD-10-CM

## 2018-08-10 DIAGNOSIS — G4719 Other hypersomnia: Secondary | ICD-10-CM

## 2018-08-10 NOTE — Telephone Encounter (Signed)
Patient is returning call. CB [phone]

## 2018-08-10 NOTE — Telephone Encounter (Signed)
Patient is returning the call. Pt CB is [phone]

## 2018-08-10 NOTE — Telephone Encounter (Signed)
Dr. Wynona Neat has reviewed the home sleep test this showed Mild sleep apnea.   Recommendations   Treatment options are CPAP with the settings auto 5 to 15.    Weight loss measures .   Advise against driving while sleepy & against medication with sedative side effects.    Make appointment for 2 months for compliance with download with Dr. Wynona Neat.   Left message for patient to call back.

## 2018-08-10 NOTE — Telephone Encounter (Signed)
Attempted to call pt but unable to reach him. Left message for pt to return call. 

## 2018-08-10 NOTE — Telephone Encounter (Signed)
2 month f/u appointment made as well. Nothing further is needed at this time.

## 2018-08-10 NOTE — Telephone Encounter (Signed)
Called and spoke to patient, made aware of HST results, okay to place order for Cpap. Order placed. Nothing further is needed at this time.

## 2018-08-20 ENCOUNTER — Telehealth: Payer: Self-pay | Admitting: *Deleted

## 2018-08-20 NOTE — Telephone Encounter (Signed)
Copied from CRM [id number]. Topic: General - Other >> Aug 20, 2018 10:35 AM Leafy Ro wrote: Reason for CRM: pt is calling and had stomach bug and per pt was told to wait until sat to see how he is . Pt needs work note from 08-15-18 and return to work today 08-20-18. Please fax to attn [employer] [phone]. Pt did not see anyone >> Aug 20, 2018 11:35 AM Jaquita Rector A wrote: Patient called back regarding a note to return to work. He was informed that Dr Kandee Keen is not in but the message would be passed to him and he is requesting a call back regarding how to handle his situation since he was not seen but that he did speak with someone on 08/15/18

## 2018-08-20 NOTE — Telephone Encounter (Signed)
PCP is not in the office today.

## 2018-08-21 NOTE — Telephone Encounter (Signed)
Ok to write note

## 2018-08-21 NOTE — Telephone Encounter (Signed)
Left message on machine for patient that his letter is ready for pick up

## 2018-09-04 ENCOUNTER — Ambulatory Visit (INDEPENDENT_AMBULATORY_CARE_PROVIDER_SITE_OTHER): Payer: 59 | Admitting: Adult Health

## 2018-09-04 ENCOUNTER — Encounter: Payer: Self-pay | Admitting: Adult Health

## 2018-09-04 VITALS — BP 118/86 | Temp 98.5°F | Wt 181.0 lb

## 2018-09-04 DIAGNOSIS — F410 Panic disorder [episodic paroxysmal anxiety] without agoraphobia: Secondary | ICD-10-CM | POA: Diagnosis not present

## 2018-09-04 DIAGNOSIS — Z23 Encounter for immunization: Secondary | ICD-10-CM

## 2018-09-04 MED ORDER — ALPRAZOLAM 0.25 MG PO TABS: 0.2500 mg | ORAL_TABLET | Freq: Two times a day (BID) | ORAL | 2 refills | Status: DC | PRN

## 2018-09-04 MED ORDER — CITALOPRAM HYDROBROMIDE 10 MG PO TABS: 10.0000 mg | ORAL_TABLET | Freq: Every day | ORAL | 1 refills | Status: DC

## 2018-09-04 NOTE — Progress Notes (Signed)
Subjective:    Patient ID: Cory Vazquez, male    DOB: [birth year], 27 y.o.   MRN: [id number]  HPI 27 year old male who  has a past medical history of Allergy, Asthma, and Depression.  He presents to the office today for follow up regarding anxiety. He was originally prescribed this medication back in August after he found out that his young son was diagnosed with Muscular Dystrophy. He has been out of Xanax for the last month and has noticed that his anxiety from his family situation and from work stressors has become worse.   He is interested in going on something daily to help with his anxiety    Review of Systems See HPI   Past Medical History:  Diagnosis Date  . Allergy   . Asthma   . Depression     Social History   Socioeconomic History  . Marital status: Married    Spouse name: Not on file  . Number of children: 1  . Years of education: 3312  . Highest education level: High school graduate  Occupational History  . Not on file  Social Needs  . Financial resource strain: Not on file  . Food insecurity:    Worry: Not on file    Inability: Not on file  . Transportation needs:    Medical: Not on file    Non-medical: Not on file  Tobacco Use  . Smoking status: Former Smoker    Packs/day: 0.50    Types: Cigarettes    Last attempt to quit: 06/08/2015    Years since quitting: 3.2  . Smokeless tobacco: Never Used  Substance and Sexual Activity  . Alcohol use: Yes    Alcohol/week: 6.0 standard drinks    Types: 6 Shots of liquor per week  . Drug use: No    Types: Marijuana  . Sexual activity: Yes  Lifestyle  . Physical activity:    Days per week: Not on file    Minutes per session: Not on file  . Stress: Not on file  Relationships  . Social connections:    Talks on phone: Not on file    Gets together: Not on file    Attends religious service: Not on file    Active member of club or organization: Not on file    Attends meetings of clubs or organizations:  Not on file    Relationship status: Not on file  . Intimate partner violence:    Fear of current or ex partner: Not on file    Emotionally abused: Not on file    Physically abused: Not on file    Forced sexual activity: Not on file  Other Topics Concern  . Not on file  Social History Narrative  . Not on file    Past Surgical History:  Procedure Laterality Date  . SEPTOPLASTY    . TONSILLECTOMY      Family History  Problem Relation Age of Onset  . Depression Mother   . Arthritis Maternal Grandmother   . Hypertension Maternal Grandmother   . Stroke Maternal Grandmother   . Hypertension Maternal Grandfather   . Diabetes Maternal Grandfather   . Arthritis Maternal Grandfather   . Cancer Paternal Grandmother   . Hearing loss Paternal Grandfather     Allergies  Allergen Reactions  . Nsaids Anaphylaxis    Current Outpatient Medications on File Prior to Visit  Medication Sig Dispense Refill  . ALPRAZolam (XANAX) 0.25 MG tablet Take 1 tablet (  0.25 mg total) by mouth 2 (two) times daily as needed for anxiety. 45 tablet 2   No current facility-administered medications on file prior to visit.     BP 118/86   Temp 98.5 F (36.9 C)   Wt 181 lb (82.1 kg)   BMI 27.52 kg/m       Objective:   Physical Exam  Constitutional: He is oriented to person, place, and time. He appears well-developed and well-nourished. No distress.  Neurological: He is alert and oriented to person, place, and time.  Skin: He is not diaphoretic.  Psychiatric: He has a normal mood and affect. His behavior is normal. Judgment and thought content normal.  Tearful when talking about his sons diagnoses  Nursing note and vitals reviewed.     Assessment & Plan:  1. Anxiety attack - start on low dose Celexa. Reviewed side effects. Follow up in one month or sooner if needed - citalopram (CELEXA) 10 MG tablet; Take 1 tablet (10 mg total) by mouth daily.  Dispense: 90 tablet; Refill: 1 - ALPRAZolam  (XANAX) 0.25 MG tablet; Take 1 tablet (0.25 mg total) by mouth 2 (two) times daily as needed for anxiety.  Dispense: 45 tablet; Refill: 2  2. Need for prophylactic vaccination and inoculation against influenza  - Flu Vaccine QUAD 6+ mos PF IM (Fluarix Quad PF)  Cory Frees, NP

## 2018-09-04 NOTE — Patient Instructions (Signed)
It was great seeing you today !  I am going to send in a prescription for Xanax and then also start you on a low dose of Celexa to help with your symptoms.   Please follow up with me after the holidays to see how you are doing or sooner if needed

## 2018-10-22 ENCOUNTER — Ambulatory Visit: Payer: PRIVATE HEALTH INSURANCE | Admitting: Pulmonary Disease

## 2018-11-05 ENCOUNTER — Other Ambulatory Visit: Payer: Self-pay

## 2018-11-05 ENCOUNTER — Encounter (HOSPITAL_BASED_OUTPATIENT_CLINIC_OR_DEPARTMENT_OTHER): Payer: Self-pay | Admitting: *Deleted

## 2018-11-05 ENCOUNTER — Emergency Department (HOSPITAL_BASED_OUTPATIENT_CLINIC_OR_DEPARTMENT_OTHER): Payer: 59

## 2018-11-05 ENCOUNTER — Emergency Department (HOSPITAL_BASED_OUTPATIENT_CLINIC_OR_DEPARTMENT_OTHER)
Admission: EM | Admit: 2018-11-05 | Discharge: 2018-11-05 | Disposition: A | Discharge status: Home / Self Care | Payer: 59 | Attending: Emergency Medicine | Admitting: Emergency Medicine

## 2018-11-05 DIAGNOSIS — S2231XA Fracture of one rib, right side, initial encounter for closed fracture: Secondary | ICD-10-CM | POA: Insufficient documentation

## 2018-11-05 DIAGNOSIS — Z79899 Other long term (current) drug therapy: Secondary | ICD-10-CM | POA: Diagnosis not present

## 2018-11-05 DIAGNOSIS — Y92810 Car as the place of occurrence of the external cause: Secondary | ICD-10-CM | POA: Diagnosis not present

## 2018-11-05 DIAGNOSIS — Z87891 Personal history of nicotine dependence: Secondary | ICD-10-CM | POA: Insufficient documentation

## 2018-11-05 DIAGNOSIS — X58XXXA Exposure to other specified factors, initial encounter: Secondary | ICD-10-CM | POA: Insufficient documentation

## 2018-11-05 DIAGNOSIS — T22121A Burn of first degree of right elbow, initial encounter: Secondary | ICD-10-CM | POA: Insufficient documentation

## 2018-11-05 DIAGNOSIS — J45909 Unspecified asthma, uncomplicated: Secondary | ICD-10-CM | POA: Insufficient documentation

## 2018-11-05 DIAGNOSIS — Y9389 Activity, other specified: Secondary | ICD-10-CM | POA: Diagnosis not present

## 2018-11-05 DIAGNOSIS — Y999 Unspecified external cause status: Secondary | ICD-10-CM | POA: Diagnosis not present

## 2018-11-05 DIAGNOSIS — T22122A Burn of first degree of left elbow, initial encounter: Secondary | ICD-10-CM

## 2018-11-05 DIAGNOSIS — S299XXA Unspecified injury of thorax, initial encounter: Secondary | ICD-10-CM | POA: Diagnosis present

## 2018-11-05 MED ORDER — BACITRACIN ZINC 500 UNIT/GM EX OINT: 1.0000 "application " | TOPICAL_OINTMENT | Freq: Two times a day (BID) | CUTANEOUS | 0 refills | Status: DC

## 2018-11-05 MED ORDER — ONDANSETRON 4 MG PO TBDP
4.0000 mg | ORAL_TABLET | Freq: Once | ORAL | Status: AC
  Administered 2018-11-05: 4 mg via ORAL
  Filled 2018-11-05: qty 1

## 2018-11-05 MED ORDER — OXYCODONE-ACETAMINOPHEN 5-325 MG PO TABS: 2.0000 | ORAL_TABLET | Freq: Four times a day (QID) | ORAL | 0 refills | Status: DC | PRN

## 2018-11-05 MED ORDER — HYDROCODONE-ACETAMINOPHEN 5-325 MG PO TABS
1.0000 | ORAL_TABLET | Freq: Once | ORAL | Status: AC
  Administered 2018-11-05: 1 via ORAL
  Filled 2018-11-05: qty 1
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
## 2018-11-05 NOTE — Discharge Instructions (Signed)
Use incentive spirometer as instructed.  Take Percocet as needed for pain.  Clean burn to left arm with water and soap twice daily and then apply bacitracin ointment and a clean, dry dressing.  Return to the ED immediately for new or worsening symptoms, such as shortness of breath, fevers, vomiting, abdominal pain or any concerns at all.
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
## 2018-11-14 ENCOUNTER — Encounter: Payer: Self-pay | Admitting: Adult Health

## 2018-11-14 ENCOUNTER — Ambulatory Visit (INDEPENDENT_AMBULATORY_CARE_PROVIDER_SITE_OTHER): Payer: 59

## 2018-11-14 ENCOUNTER — Ambulatory Visit (INDEPENDENT_AMBULATORY_CARE_PROVIDER_SITE_OTHER): Payer: 59 | Admitting: Adult Health

## 2018-11-14 VITALS — BP 108/70 | Temp 98.2°F | Wt 177.0 lb

## 2018-11-14 DIAGNOSIS — S2231XG Fracture of one rib, right side, subsequent encounter for fracture with delayed healing: Secondary | ICD-10-CM

## 2018-11-14 MED ORDER — OXYCODONE-ACETAMINOPHEN 10-325 MG PO TABS: 1.0000 | ORAL_TABLET | Freq: Four times a day (QID) | ORAL | 0 refills | Status: AC | PRN
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
## 2018-12-11 ENCOUNTER — Other Ambulatory Visit: Payer: Self-pay | Admitting: Adult Health

## 2018-12-11 DIAGNOSIS — F410 Panic disorder [episodic paroxysmal anxiety] without agoraphobia: Secondary | ICD-10-CM

## 2019-03-12 ENCOUNTER — Other Ambulatory Visit: Payer: Self-pay | Admitting: Adult Health

## 2019-03-12 ENCOUNTER — Telehealth: Payer: Self-pay | Admitting: Adult Health

## 2019-03-12 DIAGNOSIS — F410 Panic disorder [episodic paroxysmal anxiety] without agoraphobia: Secondary | ICD-10-CM

## 2019-03-12 MED ORDER — ALPRAZOLAM 0.25 MG PO TABS
ORAL_TABLET | ORAL | 2 refills | Status: DC
Start: 2019-03-12 — End: 2019-03-13

## 2019-03-12 NOTE — Telephone Encounter (Signed)
Spoke to patient and he informed me that he needs refill on Xanax. Will send to local pharmacy

## 2019-03-13 ENCOUNTER — Other Ambulatory Visit: Payer: Self-pay | Admitting: Adult Health

## 2019-03-13 DIAGNOSIS — F410 Panic disorder [episodic paroxysmal anxiety] without agoraphobia: Secondary | ICD-10-CM

## 2019-05-04 ENCOUNTER — Other Ambulatory Visit: Payer: Self-pay | Admitting: Critical Care Medicine

## 2019-05-04 DIAGNOSIS — Z20822 Contact with and (suspected) exposure to covid-19: Secondary | ICD-10-CM

## 2019-05-05 ENCOUNTER — Emergency Department (HOSPITAL_BASED_OUTPATIENT_CLINIC_OR_DEPARTMENT_OTHER)
Admission: EM | Admit: 2019-05-05 | Discharge: 2019-05-05 | Disposition: A | Discharge status: Home / Self Care | Payer: 59 | Attending: Emergency Medicine | Admitting: Emergency Medicine

## 2019-05-05 ENCOUNTER — Other Ambulatory Visit: Payer: Self-pay

## 2019-05-05 ENCOUNTER — Encounter (HOSPITAL_BASED_OUTPATIENT_CLINIC_OR_DEPARTMENT_OTHER): Payer: Self-pay | Admitting: Emergency Medicine

## 2019-05-05 DIAGNOSIS — F17290 Nicotine dependence, other tobacco product, uncomplicated: Secondary | ICD-10-CM | POA: Diagnosis not present

## 2019-05-05 DIAGNOSIS — Z79899 Other long term (current) drug therapy: Secondary | ICD-10-CM | POA: Insufficient documentation

## 2019-05-05 DIAGNOSIS — J45909 Unspecified asthma, uncomplicated: Secondary | ICD-10-CM | POA: Diagnosis not present

## 2019-05-05 DIAGNOSIS — K2900 Acute gastritis without bleeding: Secondary | ICD-10-CM | POA: Diagnosis not present

## 2019-05-05 DIAGNOSIS — R1110 Vomiting, unspecified: Secondary | ICD-10-CM | POA: Insufficient documentation

## 2019-05-05 DIAGNOSIS — R197 Diarrhea, unspecified: Secondary | ICD-10-CM | POA: Diagnosis not present

## 2019-05-05 DIAGNOSIS — R1013 Epigastric pain: Secondary | ICD-10-CM | POA: Diagnosis present

## 2019-05-05 LAB — COMPREHENSIVE METABOLIC PANEL
ALT: 18 U/L (ref 0–44)
AST: 18 U/L (ref 15–41)
Albumin: 4.4 g/dL (ref 3.5–5.0)
Alkaline Phosphatase: 43 U/L (ref 38–126)
Anion gap: 9 (ref 5–15)
BUN: 11 mg/dL (ref 6–20)
CO2: 24 mmol/L (ref 22–32)
Calcium: 9.2 mg/dL (ref 8.9–10.3)
Chloride: 106 mmol/L (ref 98–111)
Creatinine, Ser: 1.18 mg/dL (ref 0.61–1.24)
GFR calc Af Amer: >60 mL/min (ref >60)
GFR calc non Af Amer: >60 mL/min (ref >60)
Glucose, Bld: 111 mg/dL — ABNORMAL HIGH (ref 70–99)
Potassium: 3.5 mmol/L (ref 3.5–5.1)
Sodium: 139 mmol/L (ref 135–145)
Total Bilirubin: 0.5 mg/dL (ref 0.3–1.2)
Total Protein: 6.8 g/dL (ref 6.5–8.1)

## 2019-05-05 LAB — CBC WITH DIFFERENTIAL/PLATELET
Abs Immature Granulocytes: 0.03 10*3/uL (ref 0.00–0.07)
Basophils Absolute: 0 10*3/uL (ref 0.0–0.1)
Basophils Relative: 1 %
Eosinophils Absolute: 0.3 10*3/uL (ref 0.0–0.5)
Eosinophils Relative: 4 %
HCT: 46.1 % (ref 39.0–52.0)
Hemoglobin: 15.8 g/dL (ref 13.0–17.0)
Immature Granulocytes: 0 %
Lymphocytes Relative: 23 %
Lymphs Abs: 1.6 10*3/uL (ref 0.7–4.0)
MCH: 29.5 pg (ref 26.0–34.0)
MCHC: 34.3 g/dL (ref 30.0–36.0)
MCV: 86.2 fL (ref 80.0–100.0)
Monocytes Absolute: 0.5 10*3/uL (ref 0.1–1.0)
Monocytes Relative: 7 %
Neutro Abs: 4.7 10*3/uL (ref 1.7–7.7)
Neutrophils Relative %: 65 %
Platelets: 284 10*3/uL (ref 150–400)
RBC: 5.35 MIL/uL (ref 4.22–5.81)
RDW: 12.4 % (ref 11.5–15.5)
WBC: 7.2 10*3/uL (ref 4.0–10.5)
nRBC: 0 % (ref 0.0–0.2)

## 2019-05-05 LAB — URINALYSIS, ROUTINE W REFLEX MICROSCOPIC
Bilirubin Urine: NEGATIVE
Glucose, UA: NEGATIVE mg/dL
Hgb urine dipstick: NEGATIVE
Ketones, ur: NEGATIVE mg/dL
Leukocytes,Ua: NEGATIVE
Nitrite: NEGATIVE
Protein, ur: NEGATIVE mg/dL
Specific Gravity, Urine: 1.02 (ref 1.005–1.030)
pH: 7 (ref 5.0–8.0)

## 2019-05-05 LAB — LIPASE, BLOOD: Lipase: 30 U/L (ref 11–51)

## 2019-05-05 MED ORDER — ALUM & MAG HYDROXIDE-SIMETH 200-200-20 MG/5ML PO SUSP
30.0000 mL | Freq: Once | ORAL | Status: AC
  Administered 2019-05-05: 30 mL via ORAL
  Filled 2019-05-05: qty 30

## 2019-05-05 MED ORDER — LIDOCAINE VISCOUS HCL 2 % MT SOLN
15.0000 mL | Freq: Once | OROMUCOSAL | Status: AC
  Administered 2019-05-05: 15 mL via ORAL
  Filled 2019-05-05: qty 15

## 2019-05-05 MED ORDER — PANTOPRAZOLE SODIUM 20 MG PO TBEC: 20.0000 mg | DELAYED_RELEASE_TABLET | Freq: Every day | ORAL | 0 refills | Status: DC

## 2019-05-05 MED ORDER — PANTOPRAZOLE SODIUM 40 MG IV SOLR
40.0000 mg | Freq: Once | INTRAVENOUS | Status: AC
  Administered 2019-05-05: 40 mg via INTRAVENOUS
  Filled 2019-05-05: qty 40

## 2019-05-05 MED ORDER — SUCRALFATE 1 G PO TABS: 1.0000 g | ORAL_TABLET | Freq: Three times a day (TID) | ORAL | 0 refills | Status: DC

## 2019-05-05 NOTE — ED Notes (Signed)
ED Provider at bedside. 

## 2019-05-05 NOTE — ED Provider Notes (Signed)
[facility] EMERGENCY DEPARTMENT Provider Note   CSN: [id number] Arrival date & time: 05/05/19  1515    History   Chief Complaint Chief Complaint  Patient presents with  . Abdominal Pain    HPI Cory Vazquez is a 28 y.o. male.     Patient is a 28 year old male who presents with epigastric pain.  He states he has had a 2-day history of pain in his epigastrium.  Is worse after eating.  He has had some associated nonbilious, nonbloody emesis and nonbloody diarrhea.  He denies any urinary symptoms.  No fevers.  No cough or cold symptoms.  He says the pain is worse after eating.  He has had a history of similar stomach upset in the past but not this bad.  He is concerned that he may have a gastric ulcer.  He denies any prior abdominal surgeries.  He took some ginger ale which seemed to help after he burped several times.     Past Medical History:  Diagnosis Date  . Allergy   . Asthma   . Depression     There are no active problems to display for this patient.   Past Surgical History:  Procedure Laterality Date  . SEPTOPLASTY    . TONSILLECTOMY          Home Medications    Prior to Admission medications   Medication Sig Start Date End Date Taking? Authorizing Provider  ALPRAZolam (XANAX) 0.25 MG tablet TAKE 1 TABLET BY MOUTH 2 TIMES DAILY AS NEEDED FOR ANXIETY. 03/13/19  Yes Nafziger, Kandee Keenory, NP  citalopram (CELEXA) 10 MG tablet TAKE 1 TABLET BY MOUTH DAILY. 03/13/19  Yes Nafziger, Kandee Keenory, NP  bacitracin ointment Apply 1 application topically 2 (two) times daily. Apply to left arm 11/05/18   Kendrick, Caitlyn S, PA-C  pantoprazole (PROTONIX) 20 MG tablet Take 1 tablet (20 mg total) by mouth daily. 05/05/19   Rolan BuccoBelfi, Kennedie Pardoe, MD  sucralfate (CARAFATE) 1 g tablet Take 1 tablet (1 g total) by mouth 4 (four) times daily -  with meals and at bedtime. 05/05/19   Rolan BuccoBelfi, Antwan Pandya, MD    Family History Family History  Problem Relation Age of Onset  . Depression Mother    . Arthritis Maternal Grandmother   . Hypertension Maternal Grandmother   . Stroke Maternal Grandmother   . Hypertension Maternal Grandfather   . Diabetes Maternal Grandfather   . Arthritis Maternal Grandfather   . Cancer Paternal Grandmother   . Hearing loss Paternal Grandfather     Social History Social History   Tobacco Use  . Smoking status: Current Every Day Smoker    Packs/day: 0.50    Types: E-cigarettes    Last attempt to quit: 06/08/2015    Years since quitting: 3.9  . Smokeless tobacco: Never Used  Substance Use Topics  . Alcohol use: Yes    Alcohol/week: 6.0 standard drinks    Types: 6 Shots of liquor per week  . Drug use: No     Allergies   Nsaids   Review of Systems Review of Systems  Constitutional: Negative for chills, diaphoresis, fatigue and fever.  HENT: Negative for congestion, rhinorrhea and sneezing.   Eyes: Negative.   Respiratory: Negative for cough, chest tightness and shortness of breath.   Cardiovascular: Negative for chest pain and leg swelling.  Gastrointestinal: Positive for abdominal pain, diarrhea, nausea and vomiting. Negative for blood in stool.  Genitourinary: Negative for difficulty urinating, flank pain, frequency and hematuria.  Musculoskeletal:  Negative for arthralgias and back pain.  Skin: Negative for rash.  Neurological: Negative for dizziness, speech difficulty, weakness, numbness and headaches.     Physical Exam Updated Vital Signs BP 129/79 (BP Location: Right Arm)   Pulse 76   Temp 98.5 F (36.9 C) (Oral)   Resp 18   Ht 5\' 8"  (1.727 m)   Wt 86.2 kg   SpO2 99%   BMI 28.89 kg/m   Physical Exam Constitutional:      Appearance: He is well-developed.  HENT:     Head: Normocephalic and atraumatic.  Eyes:     Pupils: Pupils are equal, round, and reactive to light.  Neck:     Musculoskeletal: Normal range of motion and neck supple.  Cardiovascular:     Rate and Rhythm: Normal rate and regular rhythm.     Heart  sounds: Normal heart sounds.  Pulmonary:     Effort: Pulmonary effort is normal. No respiratory distress.     Breath sounds: Normal breath sounds. No wheezing or rales.  Chest:     Chest wall: No tenderness.  Abdominal:     General: Bowel sounds are normal.     Palpations: Abdomen is soft.     Tenderness: There is abdominal tenderness in the epigastric area. There is no guarding or rebound.  Musculoskeletal: Normal range of motion.  Lymphadenopathy:     Cervical: No cervical adenopathy.  Skin:    General: Skin is warm and dry.     Findings: No rash.  Neurological:     Mental Status: He is alert and oriented to person, place, and time.      ED Treatments / Results  Labs (all labs ordered are listed, but only abnormal results are displayed) Labs Reviewed  COMPREHENSIVE METABOLIC PANEL - Abnormal; Notable for the following components:      Result Value   Glucose, Bld 111 (*)    All other components within normal limits  LIPASE, BLOOD  CBC WITH DIFFERENTIAL/PLATELET  URINALYSIS, ROUTINE W REFLEX MICROSCOPIC    EKG None  Radiology No results found.  Procedures Procedures (including critical care time)  Medications Ordered in ED Medications  pantoprazole (PROTONIX) injection 40 mg (40 mg Intravenous Given 05/05/19 1559)  alum & mag hydroxide-simeth (MAALOX/MYLANTA) 200-200-20 MG/5ML suspension 30 mL (30 mLs Oral Given 05/05/19 1559)    And  lidocaine (XYLOCAINE) 2 % viscous mouth solution 15 mL (15 mLs Oral Given 05/05/19 1559)     Initial Impression / Assessment and Plan / ED Course  I have reviewed the triage vital signs and the nursing notes.  Pertinent labs & imaging results that were available during my care of the patient were reviewed by me and considered in my medical decision making (see chart for details).        Patient is a 28 year old male who presents with epigastric pain after eating.  His labs are non-concerning.  His LFTs are normal.  There is no  suggestion of pancreatitis.  He was given Protonix and a GI cocktail and his symptoms have completely resolved.  He has no tenderness on reexam of the abdomen so I have a low suspicion that this is gallbladder related.  I feel it is more likely gastritis.  He was discharged home in good condition.  He was started on Carafate and Protonix.  He was given a referral to follow-up with gastroenterology if his symptoms are continuing.  Return precautions were given.  Final Clinical Impressions(s) / ED Diagnoses  Final diagnoses:  Acute gastritis without hemorrhage, unspecified gastritis type    ED Discharge Orders         Ordered    pantoprazole (PROTONIX) 20 MG tablet  Daily     05/05/19 1820    sucralfate (CARAFATE) 1 g tablet  3 times daily with meals & bedtime     05/05/19 1820           Malvin Johns, MD 05/05/19 1821

## 2019-05-05 NOTE — ED Triage Notes (Addendum)
Upper abd pain x 2 days. Vomiting and diarrhea started yesterday. Tested for  COVID yesterday, results pending.

## 2019-05-07 ENCOUNTER — Ambulatory Visit: Payer: 59 | Admitting: Adult Health

## 2019-05-08 LAB — NOVEL CORONAVIRUS, NAA: SARS-CoV-2, NAA: NOT DETECTED

## 2019-06-25 ENCOUNTER — Other Ambulatory Visit: Payer: Self-pay | Admitting: Adult Health

## 2019-06-25 ENCOUNTER — Encounter: Payer: Self-pay | Admitting: Adult Health

## 2019-06-25 DIAGNOSIS — F410 Panic disorder [episodic paroxysmal anxiety] without agoraphobia: Secondary | ICD-10-CM

## 2019-06-25 DIAGNOSIS — K2100 Gastro-esophageal reflux disease with esophagitis, without bleeding: Secondary | ICD-10-CM

## 2019-06-25 MED ORDER — ALPRAZOLAM 0.25 MG PO TABS: 0.2500 mg | ORAL_TABLET | Freq: Two times a day (BID) | ORAL | 2 refills | Status: DC | PRN

## 2019-06-25 MED ORDER — PANTOPRAZOLE SODIUM 20 MG PO TBEC: 20.0000 mg | DELAYED_RELEASE_TABLET | Freq: Every day | ORAL | 1 refills | Status: DC

## 2019-07-15 ENCOUNTER — Telehealth (INDEPENDENT_AMBULATORY_CARE_PROVIDER_SITE_OTHER): Payer: 59 | Admitting: Family Medicine

## 2019-07-15 ENCOUNTER — Other Ambulatory Visit: Payer: Self-pay

## 2019-07-15 ENCOUNTER — Encounter: Payer: Self-pay | Admitting: Family Medicine

## 2019-07-15 VITALS — Temp 98.8°F | Ht 68.0 in

## 2019-07-15 DIAGNOSIS — J069 Acute upper respiratory infection, unspecified: Secondary | ICD-10-CM

## 2019-07-15 DIAGNOSIS — J029 Acute pharyngitis, unspecified: Secondary | ICD-10-CM | POA: Diagnosis not present

## 2019-07-15 MED ORDER — FLUTICASONE PROPIONATE 50 MCG/ACT NA SUSP: 1.0000 | Freq: Two times a day (BID) | NASAL | 0 refills | Status: DC

## 2019-07-15 MED ORDER — BENZONATATE 100 MG PO CAPS: 200.0000 mg | ORAL_CAPSULE | Freq: Two times a day (BID) | ORAL | 0 refills | Status: AC | PRN

## 2019-07-15 NOTE — Progress Notes (Signed)
Virtual Visit via Video Note   I connected with Cory Vazquez  on 07/15/19 by a video enabled telemedicine application and verified that I am speaking with the correct person using two identifiers.  Location patient: home Location provider:work office PCP: Dorothyann Peng; who is not available today.  Persons participating in the virtual visit: patient, provider  I discussed the limitations of evaluation and management by telemedicine and the availability of in person appointments. The patient expressed understanding and agreed to proceed.   HPI: Cory Vitali is a 28 yo male with Hx of allergies and OSA on CPAP complaining of respiratory symptoms started started today. This morning he noted nasal congestion, rhinorrhea (clear), sore throat, postnasal drainage, and temperature of 99.8 F (oral). Frontal pressure. Negative for chills, body aches, stridor, dyspnea, wheezing, chest pain, abdominal pain, nausea, vomiting, changes in bowel habits, or urinary symptoms.  Symptoms have improved throughout the day. He denies changes in the smell or taste. He is not aware of contact with COVID-19 infected person.  His daughter has been recently diagnosed with croup. Nonproductive cough and sneezing. He has not tried OTC medications.  He wonders if symptoms are related to mold exposure yesterday.   ROS: See pertinent positives and negatives per HPI.  Past Medical History:  Diagnosis Date  . Allergy   . Asthma   . Depression     Past Surgical History:  Procedure Laterality Date  . SEPTOPLASTY    . TONSILLECTOMY      Family History  Problem Relation Age of Onset  . Depression Mother   . Arthritis Maternal Grandmother   . Hypertension Maternal Grandmother   . Stroke Maternal Grandmother   . Hypertension Maternal Grandfather   . Diabetes Maternal Grandfather   . Arthritis Maternal Grandfather   . Cancer Paternal Grandmother   . Hearing loss Paternal Grandfather     Social History    Socioeconomic History  . Marital status: Married    Spouse name: Not on file  . Number of children: 1  . Years of education: 50  . Highest education level: High school graduate  Occupational History  . Not on file  Social Needs  . Financial resource strain: Not on file  . Food insecurity    Worry: Not on file    Inability: Not on file  . Transportation needs    Medical: Not on file    Non-medical: Not on file  Tobacco Use  . Smoking status: Current Every Day Smoker    Packs/day: 0.50    Types: E-cigarettes    Last attempt to quit: 06/08/2015    Years since quitting: 4.1  . Smokeless tobacco: Never Used  Substance and Sexual Activity  . Alcohol use: Yes    Alcohol/week: 6.0 standard drinks    Types: 6 Shots of liquor per week  . Drug use: No  . Sexual activity: Yes  Lifestyle  . Physical activity    Days per week: Not on file    Minutes per session: Not on file  . Stress: Not on file  Relationships  . Social Herbalist on phone: Not on file    Gets together: Not on file    Attends religious service: Not on file    Active member of club or organization: Not on file    Attends meetings of clubs or organizations: Not on file    Relationship status: Not on file  . Intimate partner violence    Fear of  current or ex partner: Not on file    Emotionally abused: Not on file    Physically abused: Not on file    Forced sexual activity: Not on file  Other Topics Concern  . Not on file  Social History Narrative  . Not on file    Current Outpatient Medications:  .  ALPRAZolam (XANAX) 0.25 MG tablet, Take 1 tablet (0.25 mg total) by mouth 2 (two) times daily as needed for anxiety., Disp: 30 tablet, Rfl: 2 .  bacitracin ointment, Apply 1 application topically 2 (two) times daily. Apply to left arm, Disp: 120 g, Rfl: 0 .  benzonatate (TESSALON) 100 MG capsule, Take 2 capsules (200 mg total) by mouth 2 (two) times daily as needed for up to 10 days., Disp: 40 capsule,  Rfl: 0 .  citalopram (CELEXA) 10 MG tablet, TAKE 1 TABLET BY MOUTH DAILY., Disp: 90 tablet, Rfl: 1 .  fluticasone (FLONASE) 50 MCG/ACT nasal spray, Place 1 spray into both nostrils 2 (two) times daily., Disp: 16 g, Rfl: 0 .  pantoprazole (PROTONIX) 20 MG tablet, Take 1 tablet (20 mg total) by mouth daily., Disp: 90 tablet, Rfl: 1 .  sucralfate (CARAFATE) 1 g tablet, Take 1 tablet (1 g total) by mouth 4 (four) times daily -  with meals and at bedtime., Disp: 30 tablet, Rfl: 0  EXAM:  VITALS per patient if applicable:Temp 98.8 F (37.1 C) (Oral)   Ht 5\' 8"  (1.727 m)   BMI 28.89 kg/m   GENERAL: alert, oriented, appears well and in no acute distress  HEENT: atraumatic, conjunctiva clear, no obvious abnormalities on inspection of external nose and ears Pharynx no erythema or edema appreciated. No exudate or uvula deviation. Nasal voice. No stridor.  NECK: normal movements of the head and neck  LUNGS: on inspection no signs of respiratory distress, breathing rate appears normal, no obvious gross SOB, gasping or wheezing. No cough during visit.  CV: no obvious cyanosis  PSYCH/NEURO: pleasant and cooperative, no obvious depression or anxiety, speech and thought processing grossly intact  ASSESSMENT AND PLAN:  Discussed the following assessment and plan:  URI, acute - Plan: benzonatate (TESSALON) 100 MG capsule, fluticasone (FLONASE) 50 MCG/ACT nasal spray  Sore throat   We discussed possible etiologies, most likely viral, so symptomatic treatment recommended. Some symptoms could certainly be related with allergies. Inspection and hx do not suggest strep infection or other serious process.  Flonase nasal spray in each nostril once daily and nasal saline irrigations as needed. Adequate hydration. Instructed to monitor for signs of complications. He is not interested in COVID-19 screening.  Continue monitoring temperature. He can go back to work when he is fever free for 3 to 4  days + improved symptoms. Work excuse posted on my chart, so he can print and send to employer.  Explained that cough and nasal congestion can last a few weeks.  Clearly instructed about warning signs.   I discussed the assessment and treatment plan with the patient. He was provided an opportunity to ask questions and all were answered. He agreed with the plan and demonstrated an understanding of the instructions.    Return if symptoms worsen or fail to improve.     , MD

## 2019-07-16 ENCOUNTER — Other Ambulatory Visit: Payer: Self-pay | Admitting: Adult Health

## 2019-07-16 ENCOUNTER — Encounter: Payer: Self-pay | Admitting: Adult Health

## 2019-07-16 DIAGNOSIS — F410 Panic disorder [episodic paroxysmal anxiety] without agoraphobia: Secondary | ICD-10-CM

## 2019-07-16 MED ORDER — CITALOPRAM HYDROBROMIDE 20 MG PO TABS: 10.0000 mg | ORAL_TABLET | Freq: Every day | ORAL | 0 refills | Status: DC

## 2019-07-18 ENCOUNTER — Other Ambulatory Visit: Payer: Self-pay | Admitting: Adult Health

## 2019-07-18 DIAGNOSIS — F410 Panic disorder [episodic paroxysmal anxiety] without agoraphobia: Secondary | ICD-10-CM

## 2019-08-02 ENCOUNTER — Encounter: Payer: Self-pay | Admitting: Adult Health

## 2019-08-02 ENCOUNTER — Other Ambulatory Visit: Payer: Self-pay | Admitting: Adult Health

## 2019-08-02 DIAGNOSIS — F410 Panic disorder [episodic paroxysmal anxiety] without agoraphobia: Secondary | ICD-10-CM

## 2019-08-02 MED ORDER — CITALOPRAM HYDROBROMIDE 20 MG PO TABS: 20.0000 mg | ORAL_TABLET | Freq: Every day | ORAL | 1 refills | Status: DC

## 2019-08-13 ENCOUNTER — Other Ambulatory Visit: Payer: Self-pay | Admitting: Family Medicine

## 2019-08-13 DIAGNOSIS — J069 Acute upper respiratory infection, unspecified: Secondary | ICD-10-CM

## 2019-08-15 NOTE — Telephone Encounter (Signed)
Message routed to PCP CMA  

## 2019-08-15 NOTE — Telephone Encounter (Signed)
Patient of Cory Vazquez

## 2019-09-27 ENCOUNTER — Other Ambulatory Visit: Payer: Self-pay | Admitting: Adult Health

## 2019-09-27 ENCOUNTER — Encounter: Payer: Self-pay | Admitting: Adult Health

## 2019-09-27 DIAGNOSIS — F410 Panic disorder [episodic paroxysmal anxiety] without agoraphobia: Secondary | ICD-10-CM

## 2019-09-27 MED ORDER — ALPRAZOLAM 0.25 MG PO TABS: 0.2500 mg | ORAL_TABLET | Freq: Two times a day (BID) | ORAL | 2 refills | Status: DC | PRN

## 2019-12-29 ENCOUNTER — Encounter: Payer: Self-pay | Admitting: Adult Health

## 2019-12-31 ENCOUNTER — Other Ambulatory Visit: Payer: Self-pay | Admitting: Adult Health

## 2019-12-31 DIAGNOSIS — F410 Panic disorder [episodic paroxysmal anxiety] without agoraphobia: Secondary | ICD-10-CM

## 2019-12-31 MED ORDER — ALPRAZOLAM 0.25 MG PO TABS: 0.2500 mg | ORAL_TABLET | Freq: Two times a day (BID) | ORAL | 2 refills | Status: DC | PRN

## 2020-01-20 ENCOUNTER — Encounter: Payer: Self-pay | Admitting: Adult Health

## 2020-03-24 ENCOUNTER — Other Ambulatory Visit: Payer: Self-pay | Admitting: Adult Health

## 2020-03-24 DIAGNOSIS — F410 Panic disorder [episodic paroxysmal anxiety] without agoraphobia: Secondary | ICD-10-CM

## 2020-03-26 ENCOUNTER — Encounter: Payer: Self-pay | Admitting: Adult Health

## 2020-03-26 ENCOUNTER — Telehealth (INDEPENDENT_AMBULATORY_CARE_PROVIDER_SITE_OTHER): Payer: Self-pay | Admitting: Adult Health

## 2020-03-26 VITALS — Wt 185.0 lb

## 2020-03-26 DIAGNOSIS — F411 Generalized anxiety disorder: Secondary | ICD-10-CM

## 2020-03-26 NOTE — Addendum Note (Signed)
Addended by: Nancy Fetter on: 03/26/2020 11:15 AM   Modules accepted: Orders

## 2020-03-26 NOTE — Progress Notes (Addendum)
Virtual Visit via Video Note  I connected with Cory Vazquez on 03/26/20 at 11:00 AM EDT by a video enabled telemedicine application and verified that I am speaking with the correct person using two identifiers.  Location patient: home Location provider:work or home office Persons participating in the virtual visit: patient, provider  I discussed the limitations of evaluation and management by telemedicine and the availability of in person appointments. The patient expressed understanding and agreed to proceed.   HPI:  29 year old male who is being evaluated today for anxiety.  He is currently prescribed Celexa 20 mg daily and Xanax 0.25 mg as needed.  He reports that his anxiety has been well managed on the Celexa.  His wife was recently diagnosed with bipolar disorder and since she has been become stable on her medications he has less anxiety provoking issues.  He has not used Xanax in the last week  ROS: See pertinent positives and negatives per HPI.  Past Medical History:  Diagnosis Date  . Allergy   . Asthma   . Depression     Past Surgical History:  Procedure Laterality Date  . SEPTOPLASTY    . TONSILLECTOMY      Family History  Problem Relation Age of Onset  . Depression Mother   . Arthritis Maternal Grandmother   . Hypertension Maternal Grandmother   . Stroke Maternal Grandmother   . Hypertension Maternal Grandfather   . Diabetes Maternal Grandfather   . Arthritis Maternal Grandfather   . Cancer Paternal Grandmother   . Hearing loss Paternal Grandfather        Current Outpatient Medications:  .  ALPRAZolam (XANAX) 0.25 MG tablet, Take 1 tablet (0.25 mg total) by mouth 2 (two) times daily as needed for anxiety., Disp: 30 tablet, Rfl: 2 .  citalopram (CELEXA) 20 MG tablet, TAKE 1 TABLET BY MOUTH EVERY DAY, Disp: 90 tablet, Rfl: 1 .  pantoprazole (PROTONIX) 20 MG tablet, Take 1 tablet (20 mg total) by mouth daily., Disp: 90 tablet, Rfl: 1 .  fluticasone (FLONASE) 50  MCG/ACT nasal spray, PLACE 1 SPRAY INTO BOTH NOSTRILS 2 (TWO) TIMES DAILY., Disp: 16 mL, Rfl: 2  EXAM:  VITALS per patient if applicable:  GENERAL: alert, oriented, appears well and in no acute distress  HEENT: atraumatic, conjunttiva clear, no obvious abnormalities on inspection of external nose and ears  NECK: normal movements of the head and neck  LUNGS: on inspection no signs of respiratory distress, breathing rate appears normal, no obvious gross SOB, gasping or wheezing  CV: no obvious cyanosis  MS: moves all visible extremities without noticeable abnormality  PSYCH/NEURO: pleasant and cooperative, no obvious depression or anxiety, speech and thought processing grossly intact  ASSESSMENT AND PLAN:  Discussed the following assessment and plan: 1. Generalized anxiety disorder - anxiety is well controlled on Celexa 20 mg.  Hopefully will no longer need Xanax and will d/c order.   We will see him back in September for his CPE      I discussed the assessment and treatment plan with the patient. The patient was provided an opportunity to ask questions and all were answered. The patient agreed with the plan and demonstrated an understanding of the instructions.   The patient was advised to call back or seek an in-person evaluation if the symptoms worsen or if the condition fails to improve as anticipated.   Shirline Frees, NP
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
## 2020-06-25 ENCOUNTER — Encounter: Payer: Self-pay | Admitting: Adult Health

## 2020-06-25 ENCOUNTER — Encounter: Payer: 59 | Admitting: Adult Health

## 2020-06-25 ENCOUNTER — Encounter: Payer: Self-pay | Admitting: Internal Medicine

## 2020-06-25 ENCOUNTER — Encounter: Payer: Self-pay | Admitting: Family Medicine

## 2020-06-25 ENCOUNTER — Telehealth (INDEPENDENT_AMBULATORY_CARE_PROVIDER_SITE_OTHER): Payer: Self-pay | Admitting: Internal Medicine

## 2020-06-25 VITALS — Wt 170.0 lb

## 2020-06-25 DIAGNOSIS — K529 Noninfective gastroenteritis and colitis, unspecified: Secondary | ICD-10-CM

## 2020-06-25 NOTE — Progress Notes (Signed)
Virtual Visit via Video Note  I connected with Cory Vazquez on 06/25/20 at  3:30 PM EDT by a video enabled telemedicine application and verified that I am speaking with the correct person using two identifiers.  Location patient: home Location provider: work office Persons participating in the virtual visit: patient, provider  I discussed the limitations of evaluation and management by telemedicine and the availability of in person appointments. The patient expressed understanding and agreed to proceed.   HPI: He had onset of n/v and diarrhea since yesterday. 5 episodes of emesis and around 12 of diarrhea. He feels it is getting better. No fever, no URI symptoms, no recent travel or sick contacts. He had seafood 2 night ago. Has been staying hydrated with water and gatorade.   ROS: Constitutional: Denies fever, chills, diaphoresis, appetite change and fatigue.  HEENT: Denies photophobia, eye pain, redness, hearing loss, ear pain, congestion, sore throat, rhinorrhea, sneezing, mouth sores, trouble swallowing, neck pain, neck stiffness and tinnitus.   Respiratory: Denies SOB, DOE, cough, chest tightness,  and wheezing.   Cardiovascular: Denies chest pain, palpitations and leg swelling.  Gastrointestinal: Denies nausea, vomiting, abdominal pain, diarrhea, constipation, blood in stool and abdominal distention.  Genitourinary: Denies dysuria, urgency, frequency, hematuria, flank pain and difficulty urinating.  Endocrine: Denies: hot or cold intolerance, sweats, changes in hair or nails, polyuria, polydipsia. Musculoskeletal: Denies myalgias, back pain, joint swelling, arthralgias and gait problem.  Skin: Denies pallor, rash and wound.  Neurological: Denies dizziness, seizures, syncope, weakness, light-headedness, numbness and headaches.  Hematological: Denies adenopathy. Easy bruising, personal or family bleeding history  Psychiatric/Behavioral: Denies suicidal ideation, mood changes,  confusion, nervousness, sleep disturbance and agitation   Past Medical History:  Diagnosis Date  . Allergy   . Asthma   . Depression     Past Surgical History:  Procedure Laterality Date  . SEPTOPLASTY    . TONSILLECTOMY      Family History  Problem Relation Age of Onset  . Depression Mother   . Arthritis Maternal Grandmother   . Hypertension Maternal Grandmother   . Stroke Maternal Grandmother   . Hypertension Maternal Grandfather   . Diabetes Maternal Grandfather   . Arthritis Maternal Grandfather   . Cancer Paternal Grandmother   . Hearing loss Paternal Grandfather     SOCIAL HX:   reports that he has been smoking e-cigarettes. He has been smoking about 0.50 packs per day. He has never used smokeless tobacco. He reports current alcohol use of about 6.0 standard drinks of alcohol per week. He reports that he does not use drugs.   Current Outpatient Medications:  .  citalopram (CELEXA) 20 MG tablet, TAKE 1 TABLET BY MOUTH EVERY DAY, Disp: 90 tablet, Rfl: 1 .  pantoprazole (PROTONIX) 20 MG tablet, Take 1 tablet (20 mg total) by mouth daily., Disp: 90 tablet, Rfl: 1  EXAM:   VITALS per patient if applicable: none reported  GENERAL: alert, oriented, appears well and in no acute distress  HEENT: atraumatic, conjunttiva clear, no obvious abnormalities on inspection of external nose and ears  NECK: normal movements of the head and neck  LUNGS: on inspection no signs of respiratory distress, breathing rate appears normal, no obvious gross increased work of breathing, gasping or wheezing  CV: no obvious cyanosis  MS: moves all visible extremities without noticeable abnormality  PSYCH/NEURO: pleasant and cooperative, no obvious depression or anxiety, speech and thought processing grossly intact  ASSESSMENT AND PLAN:   Gastroenteritis acute -Probable  some mild food poisoning or a mild case of acute viral gastroenteritis. -Advised rest, hydration. -Work note  provided. -He knows to contact us if symptoms persist/progress.    I discussed the assessment and treatment plan with the patient. The patient was provided an opportunity to ask questions and all were answered. The patient agreed with the plan and demonstrated an understanding of the instructions.   The patient was advised to call back or seek an in-person evaluation if the symptoms worsen or if the condition fails to improve as anticipated.    Cory Jan, MD  [facility]

## 2020-07-07 ENCOUNTER — Encounter: Payer: 59 | Admitting: Adult Health

## 2020-10-20 ENCOUNTER — Other Ambulatory Visit: Payer: Self-pay | Admitting: Adult Health

## 2020-10-20 DIAGNOSIS — F410 Panic disorder [episodic paroxysmal anxiety] without agoraphobia: Secondary | ICD-10-CM

## 2020-10-20 MED ORDER — CITALOPRAM HYDROBROMIDE 40 MG PO TABS: 40.0000 mg | ORAL_TABLET | Freq: Every day | ORAL | 3 refills | Status: DC

## 2020-10-20 MED ORDER — ALPRAZOLAM 0.25 MG PO TABS: 0.2500 mg | ORAL_TABLET | Freq: Two times a day (BID) | ORAL | 0 refills | Status: DC | PRN

## 2020-10-27 ENCOUNTER — Emergency Department (HOSPITAL_BASED_OUTPATIENT_CLINIC_OR_DEPARTMENT_OTHER): Payer: Self-pay

## 2020-10-27 ENCOUNTER — Encounter (HOSPITAL_BASED_OUTPATIENT_CLINIC_OR_DEPARTMENT_OTHER): Payer: Self-pay | Admitting: Emergency Medicine

## 2020-10-27 ENCOUNTER — Emergency Department (HOSPITAL_BASED_OUTPATIENT_CLINIC_OR_DEPARTMENT_OTHER)
Admission: EM | Admit: 2020-10-27 | Discharge: 2020-10-27 | Disposition: A | Discharge status: Home / Self Care | Payer: Self-pay | Attending: Emergency Medicine | Admitting: Emergency Medicine

## 2020-10-27 DIAGNOSIS — S43005A Unspecified dislocation of left shoulder joint, initial encounter: Secondary | ICD-10-CM | POA: Insufficient documentation

## 2020-10-27 DIAGNOSIS — J45909 Unspecified asthma, uncomplicated: Secondary | ICD-10-CM | POA: Insufficient documentation

## 2020-10-27 DIAGNOSIS — Y9372 Activity, wrestling: Secondary | ICD-10-CM | POA: Insufficient documentation

## 2020-10-27 DIAGNOSIS — X501XXA Overexertion from prolonged static or awkward postures, initial encounter: Secondary | ICD-10-CM | POA: Insufficient documentation

## 2020-10-27 DIAGNOSIS — Z79899 Other long term (current) drug therapy: Secondary | ICD-10-CM | POA: Insufficient documentation

## 2020-10-27 DIAGNOSIS — Z87891 Personal history of nicotine dependence: Secondary | ICD-10-CM | POA: Insufficient documentation

## 2020-10-27 MED ORDER — ONDANSETRON HCL 4 MG/2ML IJ SOLN
4.0000 mg | Freq: Once | INTRAMUSCULAR | Status: AC
  Administered 2020-10-27: 4 mg via INTRAVENOUS
  Filled 2020-10-27: qty 2

## 2020-10-27 MED ORDER — PROPOFOL 10 MG/ML IV BOLUS
INTRAVENOUS | Status: AC | PRN
  Administered 2020-10-27 (×2): 40 mg via INTRAVENOUS

## 2020-10-27 MED ORDER — PROPOFOL 10 MG/ML IV BOLUS
0.5000 mg/kg | Freq: Once | INTRAVENOUS | Status: AC
  Administered 2020-10-27: 39.7 mg via INTRAVENOUS
  Filled 2020-10-27: qty 20

## 2020-10-27 MED ORDER — HYDROMORPHONE HCL 1 MG/ML IJ SOLN
1.0000 mg | Freq: Once | INTRAMUSCULAR | Status: AC
  Administered 2020-10-27: 1 mg via INTRAVENOUS
  Filled 2020-10-27: qty 1

## 2020-10-27 NOTE — Discharge Instructions (Addendum)
Contact a health care provider if: °Your brace or sling gets damaged. °Get help right away if: °Your pain gets worse rather than better. °You lose feeling in your arm or hand. °Your arm or hand becomes white and cold. °

## 2020-10-27 NOTE — ED Triage Notes (Signed)
Pt states he was at wrestling practice and came down on his left shoulder and felt a pop  Pt is c/o severe pain ever since

## 2020-10-27 NOTE — ED Provider Notes (Signed)
.  Sedation  Date/Time: 10/27/2020 11:26 PM Performed by: Heide Scales, MD Authorized by: Heide Scales, MD   Consent:    Consent obtained:  Written   Consent given by:  Patient   Risks discussed:  Allergic reaction, dysrhythmia, inadequate sedation, nausea, prolonged hypoxia resulting in organ damage, prolonged sedation necessitating reversal, respiratory compromise necessitating ventilatory assistance and intubation and vomiting   Alternatives discussed:  Analgesia without sedation, anxiolysis and regional anesthesia Universal protocol:    Procedure explained and questions answered to patient or proxy's satisfaction: yes     Relevant documents present and verified: yes     Test results available: yes     Imaging studies available: yes     Required blood products, implants, devices, and special equipment available: yes     Site/side marked: yes     Immediately prior to procedure, a time out was called: yes     Patient identity confirmed:  Verbally with patient and arm band Indications:    Procedure performed:  Dislocation reduction   Procedure necessitating sedation performed by:  Physician performing sedation Pre-sedation assessment:    Time since last food or drink:  Hours   ASA classification: class 1 - normal, healthy patient     Mouth opening:  3 or more finger widths   Thyromental distance:  4 finger widths   Mallampati score:  I - soft palate, uvula, fauces, pillars visible   Neck mobility: normal     Pre-sedation assessments completed and reviewed: airway patency, cardiovascular function, hydration status, mental status, nausea/vomiting, pain level, respiratory function and temperature   Immediate pre-procedure details:    Reassessment: Patient reassessed immediately prior to procedure     Reviewed: vital signs, relevant labs/tests and NPO status     Verified: bag valve mask available, emergency equipment available, intubation equipment available, IV patency  confirmed, oxygen available and suction available   Procedure details (see MAR for exact dosages):    Preoxygenation:  Nasal cannula   Sedation:  Propofol   Intended level of sedation: deep   Intra-procedure monitoring:  Blood pressure monitoring, cardiac monitor, continuous pulse oximetry, frequent LOC assessments, frequent vital sign checks and continuous capnometry   Intra-procedure events: none     Total Provider sedation time (minutes):  30 Post-procedure details:    Attendance: Constant attendance by certified staff until patient recovered     Recovery: Patient returned to pre-procedure baseline     Post-sedation assessments completed and reviewed: airway patency, cardiovascular function, hydration status, mental status, nausea/vomiting, pain level, respiratory function and temperature     Patient is stable for discharge or admission: yes     Procedure completion:  Tolerated well, no immediate complications .Ortho Injury Treatment  Date/Time: 10/27/2020 11:28 PM Performed by: Heide Scales, MD Authorized by: Heide Scales, MD   Consent:    Consent obtained:  Verbal   Consent given by:  Patient   Risks discussed:  Irreducible dislocation   Alternatives discussed:  No treatment, alternative treatment and immobilizationInjury location: shoulder Location details: left shoulder Injury type: dislocation Dislocation type: anterior Hill-Sachs deformity: no Chronicity: new Pre-procedure neurovascular assessment: neurovascularly intact  Patient sedated: Yes. Refer to sedation procedure documentation for details of sedation. Immobilization: sling Post-procedure neurovascular assessment: post-procedure neurovascularly intact Patient tolerance: patient tolerated the procedure well with no immediate complications       Tegeler, Canary Brim, MD 10/27/20 2328

## 2020-10-27 NOTE — ED Notes (Signed)
ED Provider at bedside. 

## 2020-10-27 NOTE — ED Notes (Signed)
Patient transported to X-ray 

## 2020-10-27 NOTE — ED Notes (Signed)
Unable to obtain BP at 2156 due to medications still being pushed and IV at site of BP cuff

## 2020-10-27 NOTE — ED Provider Notes (Signed)
[facility] EMERGENCY DEPARTMENT Provider Note   CSN: [id number] Arrival date & time: 10/27/20  2031     History Chief Complaint  Patient presents with  . Shoulder Injury    Cory Vazquez is a 30 y.o. male who presents emergency department chief complaint of left shoulder pain.  Patient states that he was at a [employer].  He "baseball slid" and states that his body went one way and his arm went another way.  He felt his left shoulder pop.  He had immediate severe pain and inability to move the left shoulder.  He states that he is never had anything like this before.  He denies any numbness or tingling's in the lower extremity  HPI     Past Medical History:  Diagnosis Date  . Allergy   . Asthma   . Depression     There are no problems to display for this patient.   Past Surgical History:  Procedure Laterality Date  . SEPTOPLASTY    . TONSILLECTOMY         Family History  Problem Relation Age of Onset  . Depression Mother   . Arthritis Maternal Grandmother   . Hypertension Maternal Grandmother   . Stroke Maternal Grandmother   . Hypertension Maternal Grandfather   . Diabetes Maternal Grandfather   . Arthritis Maternal Grandfather   . Cancer Paternal Grandmother   . Hearing loss Paternal Grandfather     Social History   Tobacco Use  . Smoking status: Former Smoker    Packs/day: 0.50    Types: E-cigarettes    Quit date: 06/08/2015    Years since quitting: 5.3  . Smokeless tobacco: Never Used  Vaping Use  . Vaping Use: Never used  Substance Use Topics  . Alcohol use: Not Currently    Alcohol/week: 6.0 standard drinks    Types: 6 Shots of liquor per week  . Drug use: No    Home Medications Prior to Admission medications   Medication Sig Start Date End Date Taking? Authorizing Provider  ALPRAZolam (XANAX) 0.25 MG tablet Take 1 tablet (0.25 mg total) by mouth 2 (two) times daily as needed for anxiety. 10/20/20   Nafziger,  Kandee Keen, NP  citalopram (CELEXA) 40 MG tablet Take 1 tablet (40 mg total) by mouth daily. 10/20/20   Nafziger, Kandee Keen, NP  pantoprazole (PROTONIX) 20 MG tablet Take 1 tablet (20 mg total) by mouth daily. 06/25/19   Nafziger, Kandee Keen, NP    Allergies    Nsaids  Review of Systems   Review of Systems Ten systems reviewed and are negative for acute change, except as noted in the HPI.   Physical Exam Updated Vital Signs BP (!) 131/93 (BP Location: Right Arm)   Pulse 91   Temp 98.9 F (37.2 C) (Oral)   Resp (!) 22   Ht 5\' 8"  (1.727 m)   Wt 79.4 kg   SpO2 98%   BMI 26.61 kg/m   Physical Exam Vitals and nursing note reviewed.  Constitutional:      General: He is not in acute distress.    Appearance: He is well-developed and well-nourished. He is not diaphoretic.  HENT:     Head: Normocephalic and atraumatic.  Eyes:     General: No scleral icterus.    Conjunctiva/sclera: Conjunctivae normal.  Cardiovascular:     Rate and Rhythm: Normal rate and regular rhythm.     Heart sounds: Normal heart sounds.  Pulmonary:  Effort: Pulmonary effort is normal. No respiratory distress.     Breath sounds: Normal breath sounds.  Abdominal:     Palpations: Abdomen is soft.     Tenderness: There is no abdominal tenderness.  Musculoskeletal:        General: No edema.     Cervical back: Normal range of motion and neck supple.     Comments: Left shoulder with positive sulcus sign, mild muscular fasciculation, exquisitely tender to palpation.  2+ radial pulse with normal grip strength and sensation  Skin:    General: Skin is warm and dry.  Neurological:     Mental Status: He is alert.  Psychiatric:        Behavior: Behavior normal.     ED Results / Procedures / Treatments   Labs (all labs ordered are listed, but only abnormal results are displayed) Labs Reviewed - No data to display  EKG None  Radiology No results found.  Procedures Procedures (including critical care time)  Medications  Ordered in ED Medications  HYDROmorphone (DILAUDID) injection 1 mg (1 mg Intravenous Given 10/27/20 2053)  ondansetron (ZOFRAN) injection 4 mg (4 mg Intravenous Given 10/27/20 2052)    ED Course  I have reviewed the triage vital signs and the nursing notes.  Pertinent labs & imaging results that were available during my care of the patient were reviewed by me and considered in my medical decision making (see chart for details).    MDM Rules/Calculators/A&P                         Patient here with left shoulder dislocation confirmed on initial x-ray.  Please see reduction in sedation notes by Dr. Rush Landmark. Patient had a successful reduction.  I ordered and reviewed repeat film which shows interval reduction of anterior left shoulder dislocation.  Patient's shoulder feels greatly improved.  He was placed in a sling immobilizer.  He has had multiple orthopedic visits in the past and may follow-up with the orthopedist of his choice however I have given referral to Dr. Dion Saucier who is on-call for coverage for med [facility].  Patient appears otherwise neurovascularly intact and appropriate for discharge at this time. Final Clinical Impression(s) / ED Diagnoses Final diagnoses:  None    Rx / DC Orders ED Discharge Orders    None       Arthor Captain, PA-C 10/27/20 2328    Tegeler, Canary Brim, MD 10/28/20 1103

## 2020-11-05 ENCOUNTER — Other Ambulatory Visit: Payer: Self-pay

## 2020-11-05 ENCOUNTER — Encounter: Payer: Self-pay | Admitting: Adult Health

## 2020-11-05 ENCOUNTER — Ambulatory Visit (INDEPENDENT_AMBULATORY_CARE_PROVIDER_SITE_OTHER): Payer: Self-pay | Admitting: Adult Health

## 2020-11-05 VITALS — BP 110/80 | Temp 98.0°F | Wt 175.0 lb

## 2020-11-05 DIAGNOSIS — B027 Disseminated zoster: Secondary | ICD-10-CM

## 2020-11-05 DIAGNOSIS — F410 Panic disorder [episodic paroxysmal anxiety] without agoraphobia: Secondary | ICD-10-CM

## 2020-11-05 MED ORDER — VALACYCLOVIR HCL 1 G PO TABS: 1000.0000 mg | ORAL_TABLET | Freq: Three times a day (TID) | ORAL | 0 refills | Status: AC

## 2020-11-05 MED ORDER — PREDNISONE 20 MG PO TABS: 20.0000 mg | ORAL_TABLET | Freq: Every day | ORAL | 0 refills | Status: DC

## 2020-11-05 MED ORDER — ALPRAZOLAM 0.5 MG PO TABS: 0.5000 mg | ORAL_TABLET | Freq: Two times a day (BID) | ORAL | 1 refills | Status: DC | PRN

## 2020-11-05 NOTE — Progress Notes (Signed)
Subjective:    Patient ID: Cory Vazquez, male    DOB: [birth year], 30 y.o.   MRN: [id number]  HPI  30 year old male who is being evaluated today for 2 separate issues.  His first issue is that of an acute issue.  Reports that yesterday he noticed that on the right forehead he had a cluster of blisters present.  He had no pain at this time.  Denies any drainage.  Today he reports that he has started to experience some burning discomfort to the area of blisters as well as along his scalp into the back of his neck.  He denies vision changes, photophobia, or blisters elsewhere on his body.  Additionally, about a month ago we increased his Celexa from 20 mg to 40 mg and he was given a short course of Xanax to help with increasing anxiety.  Anxiety is stemming from a DUI he received 2 years ago, due to COVID his case was pushed back and was recently just placed on supervision.  He has not driving for the next year.  Reports that since increasing his Celexa and adding Xanax his anxiety is slightly improved but he continues to have episodes of severe anxiety/panic attacks.  Review of Systems See HPI   Past Medical History:  Diagnosis Date  . Allergy   . Asthma   . Depression     Social History   Socioeconomic History  . Marital status: Married    Spouse name: Not on file  . Number of children: 1  . Years of education: 80  . Highest education level: High school graduate  Occupational History  . Not on file  Tobacco Use  . Smoking status: Former Smoker    Packs/day: 0.50    Types: E-cigarettes    Quit date: 06/08/2015    Years since quitting: 5.4  . Smokeless tobacco: Never Used  Vaping Use  . Vaping Use: Never used  Substance and Sexual Activity  . Alcohol use: Not Currently    Alcohol/week: 6.0 standard drinks    Types: 6 Shots of liquor per week  . Drug use: No  . Sexual activity: Yes  Other Topics Concern  . Not on file  Social History Narrative  . Not on file    Social Determinants of Health   Financial Resource Strain: Not on file  Food Insecurity: Not on file  Transportation Needs: Not on file  Physical Activity: Not on file  Stress: Not on file  Social Connections: Not on file  Intimate Partner Violence: Not on file    Past Surgical History:  Procedure Laterality Date  . SEPTOPLASTY    . TONSILLECTOMY      Family History  Problem Relation Age of Onset  . Depression Mother   . Arthritis Maternal Grandmother   . Hypertension Maternal Grandmother   . Stroke Maternal Grandmother   . Hypertension Maternal Grandfather   . Diabetes Maternal Grandfather   . Arthritis Maternal Grandfather   . Cancer Paternal Grandmother   . Hearing loss Paternal Grandfather     Allergies  Allergen Reactions  . Nsaids Anaphylaxis    Current Outpatient Medications on File Prior to Visit  Medication Sig Dispense Refill  . citalopram (CELEXA) 40 MG tablet Take 1 tablet (40 mg total) by mouth daily. 30 tablet 3  . pantoprazole (PROTONIX) 20 MG tablet Take 1 tablet (20 mg total) by mouth daily. 90 tablet 1   No current facility-administered medications on file prior to visit.  BP 110/80   Temp 98 F (36.7 C)   Wt 175 lb (79.4 kg)   BMI 26.61 kg/m       Objective:   Physical Exam Vitals and nursing note reviewed.  Constitutional:      Appearance: Normal appearance.  HENT:     Head:      Comments: Dollar size patch of vesicular lesions.  No localized redness.  Vesicular rash does not past midline.  No ocular involvement noted.  Is painful to touch Musculoskeletal:        General: Normal range of motion.  Skin:    General: Skin is warm and dry.  Neurological:     General: No focal deficit present.     Mental Status: He is alert and oriented to person, place, and time.  Psychiatric:        Mood and Affect: Mood normal.        Thought Content: Thought content normal.        Judgment: Judgment normal.       Assessment & Plan:   1. Disseminated herpes zoster -Appears to be shingles along the V1 dermatome.  Started on Valtrex and prednisone.  He was advised to follow-up with his eye doctor with any vision changes or lesions around his eye or on his nose. - valACYclovir (VALTREX) 1000 MG tablet; Take 1 tablet (1,000 mg total) by mouth 3 (three) times daily for 10 days.  Dispense: 30 tablet; Refill: 0 - predniSONE (DELTASONE) 20 MG tablet; Take 1 tablet (20 mg total) by mouth daily with breakfast.  Dispense: 10 tablet; Refill: 0  2. Anxiety attack -We will increase Xanax to 0.5 mg twice daily as needed for anxiety.  Was advised that this will be a short course of medication - ALPRAZolam (XANAX) 0.5 MG tablet; Take 1 tablet (0.5 mg total) by mouth 2 (two) times daily as needed for anxiety.  Dispense: 45 tablet; Refill: 1   Shirline Frees, NP

## 2020-11-13 ENCOUNTER — Other Ambulatory Visit: Payer: Self-pay | Admitting: Adult Health

## 2020-11-13 DIAGNOSIS — F410 Panic disorder [episodic paroxysmal anxiety] without agoraphobia: Secondary | ICD-10-CM

## 2020-12-14 ENCOUNTER — Other Ambulatory Visit: Payer: Self-pay | Admitting: Adult Health

## 2020-12-14 DIAGNOSIS — F410 Panic disorder [episodic paroxysmal anxiety] without agoraphobia: Secondary | ICD-10-CM

## 2020-12-16 ENCOUNTER — Telehealth: Payer: Self-pay | Admitting: Adult Health

## 2020-12-16 ENCOUNTER — Encounter: Payer: Self-pay | Admitting: Adult Health

## 2020-12-16 MED ORDER — DIAZEPAM 5 MG PO TABS: 5.0000 mg | ORAL_TABLET | Freq: Two times a day (BID) | ORAL | 0 refills | Status: AC | PRN

## 2020-12-16 NOTE — Telephone Encounter (Signed)
Spoke to patient to inform me that he has to spend the weekend in jail from his second DUI that he received 2 years ago.  He  From Friday evening to Sunday evening at 7 PM.  Reports that his anxiety has been increased knowing that he has to spend the weekend in jail.  Is taking Xanax 0.5 mg twice daily.  Wondering if he can get something a little bit longer acting for anxiety to take while he is in jail.  We will have him hold his Xanax and will send in Valium 5 mg twice daily x3 days

## 2020-12-22 ENCOUNTER — Other Ambulatory Visit: Payer: Self-pay | Admitting: Adult Health

## 2020-12-22 MED ORDER — DIAZEPAM 10 MG PO TABS: 10.0000 mg | ORAL_TABLET | Freq: Two times a day (BID) | ORAL | 0 refills | Status: DC | PRN

## 2020-12-22 NOTE — Telephone Encounter (Signed)
Jacalyn from [facility] is calling and is requesting a call back regarding patient medication Xanax and Valium, please advise. CB is [phone]

## 2021-01-05 ENCOUNTER — Other Ambulatory Visit: Payer: Self-pay | Admitting: Adult Health

## 2021-01-05 MED ORDER — DIAZEPAM 10 MG PO TABS: 10.0000 mg | ORAL_TABLET | Freq: Two times a day (BID) | ORAL | 0 refills | Status: DC | PRN

## 2021-01-12 ENCOUNTER — Encounter: Payer: Self-pay | Admitting: Adult Health

## 2021-01-12 ENCOUNTER — Telehealth (INDEPENDENT_AMBULATORY_CARE_PROVIDER_SITE_OTHER): Payer: Self-pay | Admitting: Adult Health

## 2021-01-12 VITALS — Wt 200.0 lb

## 2021-01-12 DIAGNOSIS — J029 Acute pharyngitis, unspecified: Secondary | ICD-10-CM

## 2021-01-12 MED ORDER — CITALOPRAM HYDROBROMIDE 40 MG PO TABS: 40.0000 mg | ORAL_TABLET | Freq: Every day | ORAL | 1 refills | Status: DC

## 2021-01-12 MED ORDER — MAGIC MOUTHWASH W/LIDOCAINE: 5.0000 mL | Freq: Three times a day (TID) | ORAL | 0 refills | Status: DC | PRN

## 2021-01-12 MED ORDER — AMOXICILLIN 500 MG PO CAPS: 500.0000 mg | ORAL_CAPSULE | Freq: Two times a day (BID) | ORAL | 0 refills | Status: DC

## 2021-01-12 MED ORDER — PREDNISONE 20 MG PO TABS: 20.0000 mg | ORAL_TABLET | Freq: Every day | ORAL | 0 refills | Status: DC

## 2021-01-12 NOTE — Progress Notes (Signed)
Virtual Visit via Video Note  I connected with Cory Vazquez on 01/12/21 at 11:00 AM EDT by a video enabled telemedicine application and verified that I am speaking with the correct person using two identifiers.  Location patient: home Location provider:work or home office Persons participating in the virtual visit: patient, provider  I discussed the limitations of evaluation and management by telemedicine and the availability of in person appointments. The patient expressed understanding and agreed to proceed.   HPI: 30 year old male who is being evaluated today for an acute issue.  Symptoms started about 2-1/2 days ago.  Reports significant throat pain, "feels like, swallowing knives".  Redness and irritation to the back of the throat, nonproductive cough, and generalized fatigue.  Denies fevers or chills.  Has not noticed any white spots on his throat. No constant difficultly swallowing.   He is NyQuil last night but this did not helpUsed Nyquil last night without improvement    ROS: See pertinent positives and negatives per HPI.  Past Medical History:  Diagnosis Date  . Allergy   . Asthma   . Depression     Past Surgical History:  Procedure Laterality Date  . SEPTOPLASTY    . TONSILLECTOMY      Family History  Problem Relation Age of Onset  . Depression Mother   . Arthritis Maternal Grandmother   . Hypertension Maternal Grandmother   . Stroke Maternal Grandmother   . Hypertension Maternal Grandfather   . Diabetes Maternal Grandfather   . Arthritis Maternal Grandfather   . Cancer Paternal Grandmother   . Hearing loss Paternal Grandfather        Current Outpatient Medications:  .  ALPRAZolam (XANAX) 0.5 MG tablet, Take 1 tablet (0.5 mg total) by mouth 2 (two) times daily as needed for anxiety., Disp: 45 tablet, Rfl: 1 .  citalopram (CELEXA) 40 MG tablet, Take 1 tablet (40 mg total) by mouth daily., Disp: 30 tablet, Rfl: 3 .  diazepam (VALIUM) 10 MG tablet, Take 1  tablet (10 mg total) by mouth every 12 (twelve) hours as needed for anxiety., Disp: 6 tablet, Rfl: 0 .  pantoprazole (PROTONIX) 20 MG tablet, Take 1 tablet (20 mg total) by mouth daily., Disp: 90 tablet, Rfl: 1  EXAM:  VITALS per patient if applicable:  GENERAL: alert, oriented, appears well and in no acute distress  HEENT: atraumatic, conjunttiva clear, no obvious abnormalities on inspection of external nose and ears  NECK: normal movements of the head and neck  LUNGS: on inspection no signs of respiratory distress, breathing rate appears normal, no obvious gross SOB, gasping or wheezing  CV: no obvious cyanosis  MS: moves all visible extremities without noticeable abnormality  PSYCH/NEURO: pleasant and cooperative, no obvious depression or anxiety, speech and thought processing grossly intact  ASSESSMENT AND PLAN:  Discussed the following assessment and plan:  1. Sore throat -We will treat for possible strep throat due to symptoms.  Advise follow-up if no improvement in the next 36 to 48 hours. - predniSONE (DELTASONE) 20 MG tablet; Take 1 tablet (20 mg total) by mouth daily with breakfast.  Dispense: 7 tablet; Refill: 0 - amoxicillin (AMOXIL) 500 MG capsule; Take 1 capsule (500 mg total) by mouth 2 (two) times daily for 10 days.  Dispense: 20 capsule; Refill: 0 - magic mouthwash w/lidocaine SOLN; Take 5 mLs by mouth 3 (three) times daily as needed.  Dispense: 180 mL; Refill: 0     I discussed the assessment and treatment plan with the  patient. The patient was provided an opportunity to ask questions and all were answered. The patient agreed with the plan and demonstrated an understanding of the instructions.   The patient was advised to call back or seek an in-person evaluation if the symptoms worsen or if the condition fails to improve as anticipated.   Dorothyann Peng, NP

## 2021-01-15 ENCOUNTER — Other Ambulatory Visit: Payer: Self-pay | Admitting: Adult Health

## 2021-01-15 DIAGNOSIS — F410 Panic disorder [episodic paroxysmal anxiety] without agoraphobia: Secondary | ICD-10-CM

## 2021-01-19 ENCOUNTER — Telehealth: Payer: Self-pay | Admitting: Pulmonary Disease

## 2021-01-19 NOTE — Telephone Encounter (Signed)
Pt has not been seen at office since 07/25/18. Pt will need to be seen for an appt prior to [place] able to do any changes with his CPAP.  Attempted to call pt but unable to reach. Left message for pt to return call.

## 2021-01-21 NOTE — Telephone Encounter (Signed)
Pt has an OV scheduled for 02/08/2021 with Dr. Wynona Neat. Nothing further needed at this time.

## 2021-02-02 ENCOUNTER — Other Ambulatory Visit: Payer: Self-pay | Admitting: Adult Health

## 2021-02-02 MED ORDER — DIAZEPAM 10 MG PO TABS: 10.0000 mg | ORAL_TABLET | Freq: Two times a day (BID) | ORAL | 0 refills | Status: DC | PRN

## 2021-02-08 ENCOUNTER — Ambulatory Visit: Payer: Self-pay | Admitting: Pulmonary Disease

## 2021-02-15 ENCOUNTER — Other Ambulatory Visit: Payer: Self-pay | Admitting: Adult Health

## 2021-02-15 DIAGNOSIS — F410 Panic disorder [episodic paroxysmal anxiety] without agoraphobia: Secondary | ICD-10-CM

## 2021-02-17 ENCOUNTER — Other Ambulatory Visit: Payer: Self-pay | Admitting: Adult Health

## 2021-02-17 MED ORDER — ALPRAZOLAM 0.5 MG PO TABS: 0.5000 mg | ORAL_TABLET | Freq: Two times a day (BID) | ORAL | 0 refills | Status: DC | PRN

## 2021-02-22 ENCOUNTER — Encounter: Payer: Self-pay | Admitting: Pulmonary Disease

## 2021-02-22 ENCOUNTER — Ambulatory Visit (INDEPENDENT_AMBULATORY_CARE_PROVIDER_SITE_OTHER): Payer: Self-pay | Admitting: Pulmonary Disease

## 2021-02-22 ENCOUNTER — Other Ambulatory Visit: Payer: Self-pay

## 2021-02-22 VITALS — BP 126/84 | HR 93 | Temp 97.3°F | Ht 67.0 in | Wt 203.0 lb

## 2021-02-22 DIAGNOSIS — G4733 Obstructive sleep apnea (adult) (pediatric): Secondary | ICD-10-CM

## 2021-02-22 MED ORDER — METHYLPHENIDATE HCL 10 MG PO TABS: 10.0000 mg | ORAL_TABLET | Freq: Two times a day (BID) | ORAL | 0 refills | Status: DC

## 2021-02-22 NOTE — Patient Instructions (Addendum)
Obstructive sleep apnea Excessive daytime sleepiness despite compliance with CPAP therapy  Will adjust pressures from 5-15 to 8-18  DME referral for CPAP supplies-full facemask  Prescription for Ritalin 10 mg p.o. twice daily-avoid use to later in the day as it may affect your sleep -Refills are not allowed on controlled substances-you need to call us to let us know you are close to running out so that we can send a prescription in

## 2021-02-22 NOTE — Progress Notes (Signed)
Subjective:    Patient ID: Cory Vazquez, male    DOB: 02-Aug-1991, 30 y.o.   MRN: [id number] Chief complaint: Patient with history of obstructive sleep apnea Still with significant daytime sleepiness  HPI: Has been tolerating CPAP well but remains very sleepy Gets about 6 to 7 hours of sleep at night Wakes up tired Falls asleep on short car drives  His compliance reveals 87% compliance with average sleep hours of 6 hours 20  minutes  Continues to work on weight loss  He snores very loudly without CPAP, sleep is nonrestorative without CPAP  Has a history of deviated septum for which he had surgery in 20 07/2010 Does suffer from environmental allergies  Occasional headaches, occasional dryness of his mouth in the mornings  He does drink caffeinated beverages to try and stay awake  Past Medical History:  Diagnosis Date  . Allergy   . Asthma   . Depression    Social History   Socioeconomic History  . Marital status: Married    Spouse name: Not on file  . Number of children: 1  . Years of education: 53  . Highest education level: High school graduate  Occupational History  . Not on file  Tobacco Use  . Smoking status: Former Smoker    Packs/day: 0.50    Types: E-cigarettes    Quit date: 06/08/2015    Years since quitting: 5.7  . Smokeless tobacco: Never Used  Vaping Use  . Vaping Use: Never used  Substance and Sexual Activity  . Alcohol use: Not Currently    Alcohol/week: 6.0 standard drinks    Types: 6 Shots of liquor per week  . Drug use: No  . Sexual activity: Yes  Other Topics Concern  . Not on file  Social History Narrative  . Not on file   Social Determinants of Health   Financial Resource Strain: Not on file  Food Insecurity: Not on file  Transportation Needs: Not on file  Physical Activity: Not on file  Stress: Not on file  Social Connections: Not on file  Intimate Partner Violence: Not on file   Family History  Problem Relation Age of  Onset  . Depression Mother   . Arthritis Maternal Grandmother   . Hypertension Maternal Grandmother   . Stroke Maternal Grandmother   . Hypertension Maternal Grandfather   . Diabetes Maternal Grandfather   . Arthritis Maternal Grandfather   . Cancer Paternal Grandmother   . Hearing loss Paternal Grandfather     Review of Systems  Constitutional: Positive for fatigue.  HENT: Negative.   Eyes: Negative.   Respiratory: Negative.   Cardiovascular: Negative.   Gastrointestinal: Negative.   Endocrine: Negative.   Genitourinary: Negative.   Musculoskeletal: Negative.   Skin: Negative.   Allergic/Immunologic: Negative.   Neurological: Negative.   Hematological: Negative.   Psychiatric/Behavioral: Positive for sleep disturbance.   Vitals:   02/22/21 0926  BP: 126/84  Pulse: 93  Temp: (!) 97.3 F (36.3 C)  SpO2: 95%      Objective:   Physical Exam Constitutional:      General: He is not in acute distress.    Appearance: He is well-developed. He is not diaphoretic.  HENT:     Head: Normocephalic and atraumatic.     Mouth/Throat:     Mouth: Mucous membranes are moist.     Pharynx: No oropharyngeal exudate.  Eyes:     General:        Right eye: No  discharge.        Left eye: No discharge.     Pupils: Pupils are equal, round, and reactive to light.  Neck:     Thyroid: No thyromegaly.     Trachea: No tracheal deviation.  Cardiovascular:     Rate and Rhythm: Normal rate and regular rhythm.     Heart sounds: No murmur heard.   Pulmonary:     Effort: Pulmonary effort is normal. No respiratory distress.     Breath sounds: Normal breath sounds. No wheezing.  Chest:     Chest wall: No tenderness.  Musculoskeletal:     Cervical back: No rigidity or tenderness.  Neurological:     Mental Status: He is alert and oriented to person, place, and time.    Compliance data reviewed showing 87% compliance Average usage on days used 6 hours 20 minutes Machine set between 5 and  15  Median pressure of 9, 95 percentile pressure of 12.8, maximum pressure of 14 AHI of 3.2 9    Assessment & Plan:  .  Mild obstructive sleep apnea  .  Despite adequate treatment Still has significant daytime sleepiness  .  Patient would want to try a different mask  .  We also did talk about pressure changes .  We will switch his pressure from 5-15 to 8-18 .  DME referral for mask change  .  Did discuss use of stimulants to help excessive daytime sleepiness despite adequate compliance with CPAP therapy -Prescription for Ritalin   Plan. .  Ritalin 10 mg p.o. twice daily .  Adjust pressure to 8-18 .  Follow-up in 4 to 6 weeks with a download from his machine .  Encouraged to call with any significant concerns  .  I spent 30 minutes dedicated to the care of this patient on the date of this encounter to include previsit review of records, face-to-face time with the patient discussing conditions above, post visit ordering of testing, clinical documentation with electronic health record and communicated necessary findings to members of the patient's care team

## 2021-03-02 ENCOUNTER — Other Ambulatory Visit: Payer: Self-pay

## 2021-03-02 ENCOUNTER — Telehealth (INDEPENDENT_AMBULATORY_CARE_PROVIDER_SITE_OTHER): Payer: Self-pay | Admitting: Family Medicine

## 2021-03-02 ENCOUNTER — Encounter: Payer: Self-pay | Admitting: Adult Health

## 2021-03-02 ENCOUNTER — Encounter: Payer: Self-pay | Admitting: Family Medicine

## 2021-03-02 DIAGNOSIS — R112 Nausea with vomiting, unspecified: Secondary | ICD-10-CM

## 2021-03-02 DIAGNOSIS — R197 Diarrhea, unspecified: Secondary | ICD-10-CM

## 2021-03-02 MED ORDER — ONDANSETRON 8 MG PO TBDP: 8.0000 mg | ORAL_TABLET | Freq: Three times a day (TID) | ORAL | 0 refills | Status: DC | PRN

## 2021-03-02 NOTE — Progress Notes (Signed)
Patient ID: Cory Vazquez, male   DOB: December 06, 1990, 30 y.o.   MRN: [id number]  This visit type was conducted due to national recommendations for restrictions regarding the COVID-19 pandemic in an effort to limit this patient's exposure and mitigate transmission in our community.   Virtual Visit via Video Note  I connected with Cory Vazquez on 03/02/21 at  3:00 PM EDT by a video enabled telemedicine application and verified that I am speaking with the correct person using two identifiers.  Location patient: home Location provider:work or home office Persons participating in the virtual visit: patient, provider  I discussed the limitations of evaluation and management by telemedicine and the availability of in person appointments. The patient expressed understanding and agreed to proceed.   HPI:  Cory Vazquez called with yesterday of headache, nausea, vomiting, diarrhea and low-grade fever.  He tried to go to work earlier today but had to leave early because of continued symptoms.  Has had some chills and temperature up to 100.5 with body aches.  No cough.  No nasal congestion.  Keeping down some fluids.  Wife had similar though milder symptoms recently but she is recovered at this time.  No known exposures to COVID.  No localizing abdominal pain.  No reported rash.   ROS: See pertinent positives and negatives per HPI.  Past Medical History:  Diagnosis Date  . Allergy   . Asthma   . Depression     Past Surgical History:  Procedure Laterality Date  . SEPTOPLASTY    . TONSILLECTOMY      Family History  Problem Relation Age of Onset  . Depression Mother   . Arthritis Maternal Grandmother   . Hypertension Maternal Grandmother   . Stroke Maternal Grandmother   . Hypertension Maternal Grandfather   . Diabetes Maternal Grandfather   . Arthritis Maternal Grandfather   . Cancer Paternal Grandmother   . Hearing loss Paternal Grandfather     SOCIAL HX: Former smoker.  Quit 2016   Current  Outpatient Medications:  .  ALPRAZolam (XANAX) 0.5 MG tablet, Take 1 tablet (0.5 mg total) by mouth 2 (two) times daily as needed for anxiety., Disp: 45 tablet, Rfl: 0 .  citalopram (CELEXA) 40 MG tablet, Take 1 tablet (40 mg total) by mouth daily., Disp: 90 tablet, Rfl: 1 .  magic mouthwash w/lidocaine SOLN, Take 5 mLs by mouth 3 (three) times daily as needed., Disp: 180 mL, Rfl: 0 .  methylphenidate (RITALIN) 10 MG tablet, Take 1 tablet (10 mg total) by mouth 2 (two) times daily., Disp: 60 tablet, Rfl: 0 .  ondansetron (ZOFRAN ODT) 8 MG disintegrating tablet, Take 1 tablet (8 mg total) by mouth every 8 (eight) hours as needed for nausea or vomiting., Disp: 12 tablet, Rfl: 0 .  pantoprazole (PROTONIX) 20 MG tablet, Take 1 tablet (20 mg total) by mouth daily., Disp: 90 tablet, Rfl: 1  EXAM:  VITALS per patient if applicable:  GENERAL: alert, oriented, appears well and in no acute distress  HEENT: atraumatic, conjunttiva clear, no obvious abnormalities on inspection of external nose and ears  NECK: normal movements of the head and neck  LUNGS: on inspection no signs of respiratory distress, breathing rate appears normal, no obvious gross SOB, gasping or wheezing  CV: no obvious cyanosis  MS: moves all visible extremities without noticeable abnormality  PSYCH/NEURO: pleasant and cooperative, no obvious depression or anxiety, speech and thought processing grossly intact  ASSESSMENT AND PLAN:  Discussed the following assessment and plan:  Non-intractable vomiting with nausea, unspecified vomiting type  Diarrhea, unspecified type  -Patient relates onset yesterday of symptoms above.  We explained this still could potentially be COVID (vs viral gastroenteritis) and he has home COVID test and we recommend he go ahead and do that to clarify  -Send in some Zofran 8 mg ODT 1 every 8 hours as needed for nausea and vomiting  -Plenty of fluids and rest.  Work note provided 16 through the 18th  and will need to extend this if his COVID test comes back positive.  He will be in touch if this does occur.  -Slowly advance diet as tolerated with bland foods initially.   I discussed the assessment and treatment plan with the patient. The patient was provided an opportunity to ask questions and all were answered. The patient agreed with the plan and demonstrated an understanding of the instructions.   The patient was advised to call back or seek an in-person evaluation if the symptoms worsen or if the condition fails to improve as anticipated.     Evelena Peat, MD

## 2021-03-03 ENCOUNTER — Telehealth: Payer: Self-pay | Admitting: Family Medicine

## 2021-03-03 MED ORDER — PAXLOVID 20 X 150 MG & 10 X 100MG PO TBPK: 1.0000 | ORAL_TABLET | Freq: Two times a day (BID) | ORAL | 0 refills | Status: DC

## 2021-03-03 NOTE — Telephone Encounter (Signed)
Patient did test positive for COVID.  We will extend his work note out through 23 May.  He was requesting medication.  We did discuss possible use of Paxlovid-even though he appears to be relatively low risk.

## 2021-03-22 ENCOUNTER — Other Ambulatory Visit: Payer: Self-pay

## 2021-03-22 NOTE — Telephone Encounter (Signed)
Requesting refill.  Thanks!

## 2021-03-23 ENCOUNTER — Telehealth: Payer: Self-pay | Admitting: Pulmonary Disease

## 2021-03-23 NOTE — Telephone Encounter (Signed)
Patient requested Methylphenidate prescription to be sent to [facility].  Last refill- 02/22/21, methylphenidate 10mg , 2 times per day, #60, no refills  LOV 02/22/21-  Instructions  Obstructive sleep apnea Excessive daytime sleepiness despite compliance with CPAP therapy  Will adjust pressures from 5-15 to 8-18  DME referral for CPAP supplies-full facemask  Prescription for Ritalin 10 mg p.o. twice daily-avoid use to later in the day as it may affect your sleep -Refills are not allowed on controlled substances-you need to call 04/24/21 to let us know you are close to running out so that we can send a prescription in      Message routed to Dr.Olalere to advise

## 2021-03-24 ENCOUNTER — Other Ambulatory Visit: Payer: Self-pay | Admitting: Pulmonary Disease

## 2021-03-24 ENCOUNTER — Other Ambulatory Visit: Payer: Self-pay

## 2021-03-24 MED ORDER — METHYLPHENIDATE HCL 10 MG PO TABS: 10.0000 mg | ORAL_TABLET | Freq: Two times a day (BID) | ORAL | 0 refills | Status: DC

## 2021-03-24 NOTE — Telephone Encounter (Signed)
Ritalin sent in

## 2021-03-24 NOTE — Progress Notes (Unsigned)
Ritalin

## 2021-03-24 NOTE — Telephone Encounter (Signed)
Called and left detailed msg on machine ok per DPR letting pt know that the rx was sent.

## 2021-08-02 ENCOUNTER — Other Ambulatory Visit: Payer: Self-pay

## 2021-08-04 MED ORDER — METHYLPHENIDATE HCL 10 MG PO TABS: 10.0000 mg | ORAL_TABLET | Freq: Two times a day (BID) | ORAL | 0 refills | Status: DC

## 2021-08-04 NOTE — Telephone Encounter (Signed)
Hi Dr Wynona Neat, can you please refill this patients RX for Ritalin? Thank you

## 2021-08-04 NOTE — Telephone Encounter (Signed)
Ritalin refilled

## 2021-08-08 ENCOUNTER — Encounter (HOSPITAL_BASED_OUTPATIENT_CLINIC_OR_DEPARTMENT_OTHER): Payer: Self-pay | Admitting: *Deleted

## 2021-08-08 ENCOUNTER — Emergency Department (HOSPITAL_BASED_OUTPATIENT_CLINIC_OR_DEPARTMENT_OTHER): Payer: 59

## 2021-08-08 ENCOUNTER — Emergency Department (HOSPITAL_BASED_OUTPATIENT_CLINIC_OR_DEPARTMENT_OTHER)
Admission: EM | Admit: 2021-08-08 | Discharge: 2021-08-09 | Disposition: A | Discharge status: Home / Self Care | Payer: 59 | Attending: Emergency Medicine | Admitting: Emergency Medicine

## 2021-08-08 ENCOUNTER — Other Ambulatory Visit: Payer: Self-pay

## 2021-08-08 DIAGNOSIS — Z87891 Personal history of nicotine dependence: Secondary | ICD-10-CM | POA: Insufficient documentation

## 2021-08-08 DIAGNOSIS — S82141A Displaced bicondylar fracture of right tibia, initial encounter for closed fracture: Secondary | ICD-10-CM

## 2021-08-08 DIAGNOSIS — S82142A Displaced bicondylar fracture of left tibia, initial encounter for closed fracture: Secondary | ICD-10-CM | POA: Diagnosis not present

## 2021-08-08 DIAGNOSIS — J45909 Unspecified asthma, uncomplicated: Secondary | ICD-10-CM | POA: Insufficient documentation

## 2021-08-08 DIAGNOSIS — M25562 Pain in left knee: Secondary | ICD-10-CM

## 2021-08-08 DIAGNOSIS — W11XXXA Fall on and from ladder, initial encounter: Secondary | ICD-10-CM | POA: Diagnosis not present

## 2021-08-08 DIAGNOSIS — S8992XA Unspecified injury of left lower leg, initial encounter: Secondary | ICD-10-CM | POA: Diagnosis present

## 2021-08-08 LAB — CBC WITH DIFFERENTIAL/PLATELET
Abs Immature Granulocytes: 0.08 10*3/uL — ABNORMAL HIGH (ref 0.00–0.07)
Basophils Absolute: 0 10*3/uL (ref 0.0–0.1)
Basophils Relative: 0 %
Eosinophils Absolute: 0 10*3/uL (ref 0.0–0.5)
Eosinophils Relative: 0 %
HCT: 46.5 % (ref 39.0–52.0)
Hemoglobin: 16.8 g/dL (ref 13.0–17.0)
Immature Granulocytes: 0 %
Lymphocytes Relative: 7 %
Lymphs Abs: 1.3 10*3/uL (ref 0.7–4.0)
MCH: 30.3 pg (ref 26.0–34.0)
MCHC: 36.1 g/dL — ABNORMAL HIGH (ref 30.0–36.0)
MCV: 83.9 fL (ref 80.0–100.0)
Monocytes Absolute: 0.9 10*3/uL (ref 0.1–1.0)
Monocytes Relative: 5 %
Neutro Abs: 15.9 10*3/uL — ABNORMAL HIGH (ref 1.7–7.7)
Neutrophils Relative %: 88 %
Platelets: 301 10*3/uL (ref 150–400)
RBC: 5.54 MIL/uL (ref 4.22–5.81)
RDW: 12.8 % (ref 11.5–15.5)
WBC: 18.1 10*3/uL — ABNORMAL HIGH (ref 4.0–10.5)
nRBC: 0 % (ref 0.0–0.2)

## 2021-08-08 LAB — BASIC METABOLIC PANEL
Anion gap: 11 (ref 5–15)
BUN: 11 mg/dL (ref 6–20)
CO2: 22 mmol/L (ref 22–32)
Calcium: 9.4 mg/dL (ref 8.9–10.3)
Chloride: 107 mmol/L (ref 98–111)
Creatinine, Ser: 1.22 mg/dL (ref 0.61–1.24)
GFR, Estimated: >60 mL/min (ref >60)
Glucose, Bld: 110 mg/dL — ABNORMAL HIGH (ref 70–99)
Potassium: 3.3 mmol/L — ABNORMAL LOW (ref 3.5–5.1)
Sodium: 140 mmol/L (ref 135–145)

## 2021-08-08 MED ORDER — ONDANSETRON HCL 4 MG/2ML IJ SOLN
4.0000 mg | Freq: Once | INTRAMUSCULAR | Status: AC
  Administered 2021-08-08: 4 mg via INTRAVENOUS
  Filled 2021-08-08: qty 2

## 2021-08-08 MED ORDER — ONDANSETRON 4 MG PO TBDP
4.0000 mg | ORAL_TABLET | Freq: Once | ORAL | Status: AC | PRN
  Administered 2021-08-08: 4 mg via ORAL
  Filled 2021-08-08: qty 1

## 2021-08-08 MED ORDER — HYDROMORPHONE HCL 1 MG/ML IJ SOLN
1.0000 mg | Freq: Once | INTRAMUSCULAR | Status: AC
  Administered 2021-08-08: 1 mg via INTRAVENOUS
  Filled 2021-08-08: qty 1

## 2021-08-08 MED ORDER — OXYCODONE-ACETAMINOPHEN 5-325 MG PO TABS
1.0000 | ORAL_TABLET | ORAL | Status: DC | PRN
  Administered 2021-08-08: 1 via ORAL
  Filled 2021-08-08: qty 1

## 2021-08-08 NOTE — ED Triage Notes (Signed)
Pt reports fell off ladder onto grass approx 3-4 ft, landing directly on left knee. Reports unable to walk and has vomited multiple times due to pain

## 2021-08-08 NOTE — ED Notes (Signed)
Pt moderate distress in bed, a/ox4. Pt states he fell off a ladder landing directly on his left knee. Pt states he fell approximately 4 feet. Denies head/neck injury or LOC. Denies ETOH use. +pmsc

## 2021-08-09 ENCOUNTER — Emergency Department (HOSPITAL_BASED_OUTPATIENT_CLINIC_OR_DEPARTMENT_OTHER): Payer: 59

## 2021-08-09 ENCOUNTER — Encounter (HOSPITAL_BASED_OUTPATIENT_CLINIC_OR_DEPARTMENT_OTHER): Payer: Self-pay | Admitting: Emergency Medicine

## 2021-08-09 DIAGNOSIS — S82142A Displaced bicondylar fracture of left tibia, initial encounter for closed fracture: Secondary | ICD-10-CM | POA: Diagnosis not present

## 2021-08-09 MED ORDER — HALOPERIDOL LACTATE 5 MG/ML IJ SOLN
1.0000 mg | Freq: Once | INTRAMUSCULAR | Status: AC
  Administered 2021-08-09: 1 mg via INTRAVENOUS
  Filled 2021-08-09: qty 1

## 2021-08-09 MED ORDER — ONDANSETRON 8 MG PO TBDP: ORAL_TABLET | ORAL | 0 refills | Status: DC

## 2021-08-09 MED ORDER — IOHEXOL 350 MG/ML SOLN
100.0000 mL | Freq: Once | INTRAVENOUS | Status: AC | PRN
  Administered 2021-08-09: 100 mL via INTRAVENOUS

## 2021-08-09 MED ORDER — OXYCODONE-ACETAMINOPHEN 5-325 MG PO TABS: 1.0000 | ORAL_TABLET | Freq: Four times a day (QID) | ORAL | 0 refills | Status: DC | PRN

## 2021-08-09 NOTE — ED Notes (Signed)
Pt NAD, a/ox4. Pt verbalizes understanding of all DC and f/u instructions. All questions answered. Pt wheeled out in w/c to lobby with spouse.

## 2021-08-09 NOTE — ED Notes (Signed)
Pt return from CT. Family requesting anti-emetic, MD Palumbo made aware

## 2021-08-09 NOTE — ED Notes (Signed)
Patient transported to CT 

## 2021-08-09 NOTE — ED Provider Notes (Signed)
[facility] EMERGENCY DEPARTMENT Provider Note   CSN: [id number] Arrival date & time: 08/08/21  2143     History Chief Complaint  Patient presents with   Knee Injury    Cory Vazquez is a 30 y.o. male.  The history is provided by the patient.  Knee Pain Location:  Knee Injury: yes   Knee location:  L knee Pain details:    Radiates to:  Does not radiate   Severity:  Severe   Onset quality:  Sudden   Timing:  Constant   Progression:  Unchanged Chronicity:  New Dislocation: no   Foreign body present:  No foreign bodies Prior injury to area:  No Relieved by:  Nothing Worsened by:  Nothing Ineffective treatments:  None tried Associated symptoms: no back pain, no fatigue and no muscle weakness   Risk factors: no concern for non-accidental trauma   Patient reports that he fell off a ladder 3.5 feet directly onto his Left knee causing immediate pain.      Past Medical History:  Diagnosis Date   Allergy    Asthma    Depression     There are no problems to display for this patient.   Past Surgical History:  Procedure Laterality Date   SEPTOPLASTY     TONSILLECTOMY         Family History  Problem Relation Age of Onset   Depression Mother    Arthritis Maternal Grandmother    Hypertension Maternal Grandmother    Stroke Maternal Grandmother    Hypertension Maternal Grandfather    Diabetes Maternal Grandfather    Arthritis Maternal Grandfather    Cancer Paternal Grandmother    Hearing loss Paternal Grandfather     Social History   Tobacco Use   Smoking status: Former    Packs/day: 0.50    Types: E-cigarettes, Cigarettes    Quit date: 06/08/2015    Years since quitting: 6.1   Smokeless tobacco: Never  Vaping Use   Vaping Use: Every day  Substance Use Topics   Alcohol use: Not Currently    Alcohol/week: 6.0 standard drinks    Types: 6 Shots of liquor per week   Drug use: No    Home Medications Prior to Admission medications    Medication Sig Start Date End Date Taking? Authorizing Provider  ondansetron (ZOFRAN ODT) 8 MG disintegrating tablet 8mg  ODT q8 hours prn nausea 08/09/21  Yes Loula Marcella, MD  ALPRAZolam May) 0.5 MG tablet Take 1 tablet (0.5 mg total) by mouth 2 (two) times daily as needed for anxiety. 02/17/21   Nafziger, 04/19/21, NP  citalopram (CELEXA) 40 MG tablet Take 1 tablet (40 mg total) by mouth daily. 01/12/21   Nafziger, 01/14/21, NP  magic mouthwash w/lidocaine SOLN Take 5 mLs by mouth 3 (three) times daily as needed. 01/12/21   Nafziger, 01/14/21, NP  methylphenidate (RITALIN) 10 MG tablet Take 1 tablet (10 mg total) by mouth 2 (two) times daily. 08/04/21   08/06/21, MD  Nirmatrelvir & Ritonavir (PAXLOVID) 20 x 150 MG & 10 x 100MG  TBPK Take 1 tablet by mouth in the morning and at bedtime. Take one dose by mouth twice daily for 5 days. 03/03/21   Burchette, , MD  ondansetron (ZOFRAN ODT) 8 MG disintegrating tablet Take 1 tablet (8 mg total) by mouth every 8 (eight) hours as needed for nausea or vomiting. 03/02/21   Burchette, Elberta Fortis, MD  oxyCODONE-acetaminophen (PERCOCET) 5-325 MG tablet Take 1 tablet by  mouth every 6 (six) hours as needed. 08/09/21   Sereniti Wan, MD  pantoprazole (PROTONIX) 20 MG tablet Take 1 tablet (20 mg total) by mouth daily. 06/25/19   Shirline Frees, NP    Allergies    Nsaids  Review of Systems   Review of Systems  Constitutional:  Negative for fatigue.  HENT:  Negative for facial swelling.   Eyes:  Negative for redness.  Respiratory:  Negative for stridor.   Gastrointestinal:  Negative for vomiting.  Genitourinary:  Negative for difficulty urinating.  Musculoskeletal:  Positive for arthralgias. Negative for back pain.  Skin:  Negative for rash.  Neurological:  Negative for facial asymmetry.  Psychiatric/Behavioral:  Negative for agitation.   All other systems reviewed and are negative.  Physical Exam Updated Vital Signs BP (!) 130/95   Pulse 92   Resp 20    Ht 5\' 7"  (1.702 m)   Wt 88.5 kg   SpO2 95%   BMI 30.54 kg/m   Physical Exam Vitals and nursing note reviewed.  Constitutional:      General: He is not in acute distress.    Appearance: Normal appearance.  HENT:     Head: Normocephalic and atraumatic.     Nose: Nose normal.  Eyes:     Conjunctiva/sclera: Conjunctivae normal.  Cardiovascular:     Rate and Rhythm: Normal rate and regular rhythm.     Pulses: Normal pulses.     Heart sounds: Normal heart sounds.  Pulmonary:     Effort: Pulmonary effort is normal.     Breath sounds: Normal breath sounds.  Abdominal:     General: Abdomen is flat. Bowel sounds are normal.     Palpations: Abdomen is soft.     Tenderness: There is no abdominal tenderness. There is no guarding.  Musculoskeletal:        General: No swelling. Normal range of motion.     Cervical back: Normal range of motion and neck supple.     Right knee: Normal.     Left knee: No deformity, erythema, ecchymosis, lacerations, bony tenderness or crepitus. Tenderness present over the medial joint line and lateral joint line. No patellar tendon tenderness. No LCL laxity, MCL laxity or ACL laxity.Normal alignment, normal meniscus and normal patellar mobility.     Instability Tests: Anterior drawer test negative. Posterior drawer test negative.     Right ankle: Normal.     Right Achilles Tendon: Normal.     Left ankle: Normal.     Left Achilles Tendon: Normal.     Right foot: Normal. Normal capillary refill. No crepitus.     Left foot: Normal. Normal capillary refill. No crepitus.     Comments: All compartments of BLE soft, no swelling on initial exam   Skin:    General: Skin is warm and dry.     Capillary Refill: Capillary refill takes less than 2 seconds.  Neurological:     General: No focal deficit present.     Mental Status: He is alert and oriented to person, place, and time.     Deep Tendon Reflexes: Reflexes normal.  Psychiatric:        Mood and Affect: Mood  normal.        Behavior: Behavior normal.    ED Results / Procedures / Treatments   Labs (all labs ordered are listed, but only abnormal results are displayed) Results for orders placed or performed during the hospital encounter of 08/08/21  CBC with Differential/Platelet  Result  Value Ref Range   WBC 18.1 (H) 4.0 - 10.5 K/uL   RBC 5.54 4.22 - 5.81 MIL/uL   Hemoglobin 16.8 13.0 - 17.0 g/dL   HCT 16.0 10.9 - 32.3 %   MCV 83.9 80.0 - 100.0 fL   MCH 30.3 26.0 - 34.0 pg   MCHC 36.1 (H) 30.0 - 36.0 g/dL   RDW 55.7 32.2 - 02.5 %   Platelets 301 150 - 400 K/uL   nRBC 0.0 0.0 - 0.2 %   Neutrophils Relative % 88 %   Neutro Abs 15.9 (H) 1.7 - 7.7 K/uL   Lymphocytes Relative 7 %   Lymphs Abs 1.3 0.7 - 4.0 K/uL   Monocytes Relative 5 %   Monocytes Absolute 0.9 0.1 - 1.0 K/uL   Eosinophils Relative 0 %   Eosinophils Absolute 0.0 0.0 - 0.5 K/uL   Basophils Relative 0 %   Basophils Absolute 0.0 0.0 - 0.1 K/uL   Immature Granulocytes 0 %   Abs Immature Granulocytes 0.08 (H) 0.00 - 0.07 K/uL  Basic metabolic panel  Result Value Ref Range   Sodium 140 135 - 145 mmol/L   Potassium 3.3 (L) 3.5 - 5.1 mmol/L   Chloride 107 98 - 111 mmol/L   CO2 22 22 - 32 mmol/L   Glucose, Bld 110 (H) 70 - 99 mg/dL   BUN 11 6 - 20 mg/dL   Creatinine, Ser 4.27 0.61 - 1.24 mg/dL   Calcium 9.4 8.9 - 06.2 mg/dL   GFR, Estimated >37 >62 mL/min   Anion gap 11 5 - 15   CT ANGIO LOW EXTREM LEFT W &/OR WO CONTRAST  Result Date: 08/09/2021 CLINICAL DATA:  Left knee dislocation. EXAM: CT ANGIOGRAPHY OF THE left lowerEXTREMITY TECHNIQUE: Multidetector CT imaging of the left lowerwas performed using the standard protocol during bolus administration of intravenous contrast. Multiplanar CT image reconstructions and MIPs were obtained to evaluate the vascular anatomy. CONTRAST:  OMNIPAQUE IOHEXOL 350 MG/ML SOLN COMPARISON:  Left knee radiograph dated 08/08/2021. FINDINGS: The visualized distal abdominal aorta,  bilateral internal and external iliac arteries are patent. The left common femoral artery, superficial and deep femoral arteries, popliteal artery, and the calf arteries are patent to the level of the ankle. The dorsalis pedis and plantar arteries appear patent. No evidence of traumatic arterial injury. No extravasation of contrast to suggest active arterial bleed. There is a minimally depressed fracture of the lateral left tibial plateau. There is a moderate suprapatellar hemarthrosis. The soft tissues are unremarkable. Review of the MIP images confirms the above findings. IMPRESSION: No evidence of traumatic arterial injury or active arterial bleed. Electronically Signed   By: Elgie Collard M.D.   On: 08/09/2021 00:54   DG Knee Complete 4 Views Left  Result Date: 08/08/2021 CLINICAL DATA:  Fall from ladder onto left knee. Pain. Unable to ambulate. Swelling. Limited range of motion. EXAM: LEFT KNEE - COMPLETE 4+ VIEW COMPARISON:  None. FINDINGS: Findings suspicious for nondisplaced lateral tibial plateau fracture, with fracture lucency seen only on 1 of the oblique views. There is a suspected joint effusion, not well assessed on the lateral view due to positioning. No other fracture. The joint spaces are preserved. Generalized soft tissue edema. IMPRESSION: Findings suspicious for nondisplaced lateral tibial plateau fracture. Suspected joint effusion. Electronically Signed   By: Narda Rutherford M.D.   On: 08/08/2021 23:05     Radiology CT ANGIO LOW EXTREM LEFT W &/OR WO CONTRAST  Result Date: 08/09/2021 CLINICAL DATA:  Left knee dislocation. EXAM: CT ANGIOGRAPHY OF THE left lowerEXTREMITY TECHNIQUE: Multidetector CT imaging of the left lowerwas performed using the standard protocol during bolus administration of intravenous contrast. Multiplanar CT image reconstructions and MIPs were obtained to evaluate the vascular anatomy. CONTRAST:  OMNIPAQUE IOHEXOL 350 MG/ML SOLN COMPARISON:  Left knee  radiograph dated 08/08/2021. FINDINGS: The visualized distal abdominal aorta, bilateral internal and external iliac arteries are patent. The left common femoral artery, superficial and deep femoral arteries, popliteal artery, and the calf arteries are patent to the level of the ankle. The dorsalis pedis and plantar arteries appear patent. No evidence of traumatic arterial injury. No extravasation of contrast to suggest active arterial bleed. There is a minimally depressed fracture of the lateral left tibial plateau. There is a moderate suprapatellar hemarthrosis. The soft tissues are unremarkable. Review of the MIP images confirms the above findings. IMPRESSION: No evidence of traumatic arterial injury or active arterial bleed. Electronically Signed   By: Elgie Collard M.D.   On: 08/09/2021 00:54   DG Knee Complete 4 Views Left  Result Date: 08/08/2021 CLINICAL DATA:  Fall from ladder onto left knee. Pain. Unable to ambulate. Swelling. Limited range of motion. EXAM: LEFT KNEE - COMPLETE 4+ VIEW COMPARISON:  None. FINDINGS: Findings suspicious for nondisplaced lateral tibial plateau fracture, with fracture lucency seen only on 1 of the oblique views. There is a suspected joint effusion, not well assessed on the lateral view due to positioning. No other fracture. The joint spaces are preserved. Generalized soft tissue edema. IMPRESSION: Findings suspicious for nondisplaced lateral tibial plateau fracture. Suspected joint effusion. Electronically Signed   By: Narda Rutherford M.D.   On: 08/08/2021 23:05    Procedures Procedures   Medications Ordered in ED Medications  ondansetron (ZOFRAN-ODT) disintegrating tablet 4 mg (4 mg Oral Given 08/08/21 2157)  HYDROmorphone (DILAUDID) injection 1 mg (1 mg Intravenous Given 08/08/21 2304)  ondansetron (ZOFRAN) injection 4 mg (4 mg Intravenous Given 08/08/21 2302)  iohexol (OMNIPAQUE) 350 MG/ML injection 100 mL (100 mLs Intravenous Contrast Given 08/09/21 0040)   haloperidol lactate (HALDOL) injection 1 mg (1 mg Intravenous Given 08/09/21 0100)    ED Course  I have reviewed the triage vital signs and the nursing notes.  Pertinent labs & imaging results that were available during my care of the patient were reviewed by me and considered in my medical decision making (see chart for details).   130 am: case d/w Dr. Magnus Ivan of orthopedics.  He has reviewed the films.  Ice and elevation, knee immobilizer and non weight bearing.  145 Leg reexamined prior to knee immobilizer,  legs equal sizes pulses intact all compartments are soft BLE.    Ice elevation, Percocet, zofran.  Use crutches at all times.  Non weight bearing, follow up with orthopedics.     Cory Vazquez was evaluated in Emergency Department on 08/09/2021 for the symptoms described in the history of present illness. He was evaluated in the context of the global COVID-19 pandemic, which necessitated consideration that the patient might be at risk for infection with the SARS-CoV-2 virus that causes COVID-19. Institutional protocols and algorithms that pertain to the evaluation of patients at risk for COVID-19 are in a state of rapid change based on information released by regulatory bodies including the CDC and federal and state organizations. These policies and algorithms were followed during the patient's care in the ED.  Final Clinical Impression(s) / ED Diagnoses Final diagnoses:  Acute pain of left knee  Tibial plateau fracture, right, closed, initial encounter   Return for intractable cough, coughing up blood, fevers > 100.4 unrelieved by medication, shortness of breath, intractable vomiting, chest pain, shortness of breath, weakness, numbness, changes in speech, facial asymmetry, abdominal pain, passing out, Inability to tolerate liquids or food, cough, altered mental status or any concerns. No signs of systemic illness or infection. The patient is nontoxic-appearing on exam and vital  signs are within normal limits.  I have reviewed the triage vital signs and the nursing notes. Pertinent labs & imaging results that were available during my care of the patient were reviewed by me and considered in my medical decision making (see chart for details). After history, exam, and medical workup I feel the patient has been appropriately medically screened and is safe for discharge home. Pertinent diagnoses were discussed with the patient. Patient was given return precautions.  Rx / DC Orders ED Discharge Orders          Ordered    oxyCODONE-acetaminophen (PERCOCET) 5-325 MG tablet  Every 6 hours PRN,   Status:  Discontinued        08/09/21 0101    ondansetron (ZOFRAN ODT) 8 MG disintegrating tablet        08/09/21 0101    oxyCODONE-acetaminophen (PERCOCET) 5-325 MG tablet  Every 6 hours PRN        08/09/21 0102             Roux Brandy, MD 08/09/21 [id number]

## 2021-08-11 ENCOUNTER — Encounter (HOSPITAL_BASED_OUTPATIENT_CLINIC_OR_DEPARTMENT_OTHER): Payer: Self-pay

## 2021-08-11 ENCOUNTER — Emergency Department (HOSPITAL_BASED_OUTPATIENT_CLINIC_OR_DEPARTMENT_OTHER): Payer: 59

## 2021-08-11 ENCOUNTER — Other Ambulatory Visit: Payer: Self-pay

## 2021-08-11 ENCOUNTER — Emergency Department (HOSPITAL_BASED_OUTPATIENT_CLINIC_OR_DEPARTMENT_OTHER)
Admission: EM | Admit: 2021-08-11 | Discharge: 2021-08-11 | Disposition: A | Discharge status: Home / Self Care | Payer: 59 | Attending: Emergency Medicine | Admitting: Emergency Medicine

## 2021-08-11 DIAGNOSIS — W11XXXA Fall on and from ladder, initial encounter: Secondary | ICD-10-CM | POA: Insufficient documentation

## 2021-08-11 DIAGNOSIS — F419 Anxiety disorder, unspecified: Secondary | ICD-10-CM | POA: Diagnosis not present

## 2021-08-11 DIAGNOSIS — R112 Nausea with vomiting, unspecified: Secondary | ICD-10-CM | POA: Diagnosis not present

## 2021-08-11 DIAGNOSIS — J45909 Unspecified asthma, uncomplicated: Secondary | ICD-10-CM | POA: Insufficient documentation

## 2021-08-11 DIAGNOSIS — S161XXA Strain of muscle, fascia and tendon at neck level, initial encounter: Secondary | ICD-10-CM | POA: Insufficient documentation

## 2021-08-11 DIAGNOSIS — Z87891 Personal history of nicotine dependence: Secondary | ICD-10-CM | POA: Insufficient documentation

## 2021-08-11 DIAGNOSIS — M79662 Pain in left lower leg: Secondary | ICD-10-CM | POA: Insufficient documentation

## 2021-08-11 DIAGNOSIS — R0602 Shortness of breath: Secondary | ICD-10-CM | POA: Diagnosis present

## 2021-08-11 DIAGNOSIS — M79605 Pain in left leg: Secondary | ICD-10-CM

## 2021-08-11 LAB — CBG MONITORING, ED: Glucose-Capillary: 134 mg/dL — ABNORMAL HIGH (ref 70–99)

## 2021-08-11 MED ORDER — ONDANSETRON 8 MG PO TBDP: ORAL_TABLET | ORAL | 0 refills | Status: DC

## 2021-08-11 MED ORDER — HYDROMORPHONE HCL 1 MG/ML IJ SOLN
1.0000 mg | Freq: Once | INTRAMUSCULAR | Status: DC
Start: 2021-08-11 — End: 2021-08-11
  Filled 2021-08-11: qty 1

## 2021-08-11 MED ORDER — OXYCODONE-ACETAMINOPHEN 5-325 MG PO TABS: 1.0000 | ORAL_TABLET | Freq: Four times a day (QID) | ORAL | 0 refills | Status: AC | PRN

## 2021-08-11 MED ORDER — LIDOCAINE VISCOUS HCL 2 % MT SOLN
15.0000 mL | Freq: Once | OROMUCOSAL | Status: AC
  Administered 2021-08-11: 15 mL via ORAL
  Filled 2021-08-11: qty 15

## 2021-08-11 MED ORDER — ALUM & MAG HYDROXIDE-SIMETH 200-200-20 MG/5ML PO SUSP
30.0000 mL | Freq: Once | ORAL | Status: AC
  Administered 2021-08-11: 30 mL via ORAL
  Filled 2021-08-11: qty 30

## 2021-08-11 MED ORDER — IOHEXOL 350 MG/ML SOLN
100.0000 mL | Freq: Once | INTRAVENOUS | Status: AC | PRN
  Administered 2021-08-11: 75 mL via INTRAVENOUS

## 2021-08-11 MED ORDER — ONDANSETRON HCL 4 MG/2ML IJ SOLN
4.0000 mg | Freq: Once | INTRAMUSCULAR | Status: AC
  Administered 2021-08-11: 4 mg via INTRAVENOUS
  Filled 2021-08-11: qty 2

## 2021-08-11 MED ORDER — HYDROCODONE-ACETAMINOPHEN 5-325 MG PO TABS
1.0000 | ORAL_TABLET | Freq: Once | ORAL | Status: AC
  Administered 2021-08-11: 1 via ORAL
  Filled 2021-08-11 (×2): qty 1

## 2021-08-11 MED ORDER — OXYCODONE-ACETAMINOPHEN 5-325 MG PO TABS: 1.0000 | ORAL_TABLET | Freq: Four times a day (QID) | ORAL | 0 refills | Status: DC | PRN

## 2021-08-11 MED ORDER — LACTATED RINGERS IV BOLUS
1000.0000 mL | Freq: Once | INTRAVENOUS | Status: AC
  Administered 2021-08-11: 1000 mL via INTRAVENOUS

## 2021-08-11 NOTE — ED Notes (Signed)
Pt tolerating po fluids at this time

## 2021-08-11 NOTE — ED Notes (Signed)
Pt sts he has been vomiting "like 5 times a day" since the fall; sts he vomited after taking ondansetron at home this morning.

## 2021-08-11 NOTE — ED Triage Notes (Signed)
Pt arrives to ED with c/o stabbing pain in back of neck and states he is unable to catch his breath. Pt was seen her on Sunday for fall from ladder, pt estimated about 3-4 feet. States he has been vomiting since with chills and unable to keep anything down. Pt also reports tingling in face and arms, breathing fast upon arrival. Pt in left knee immobilizer and crutches from fall on Sunday.

## 2021-08-11 NOTE — ED Provider Notes (Signed)
[facility] EMERGENCY DEPARTMENT Provider Note   CSN: [id number] Arrival date & time: 08/11/21  0845     History Chief Complaint  Patient presents with   Shortness of Breath   Neck Pain    Cory Vazquez is a 30 y.o. male.   Shortness of Breath Associated symptoms: neck pain and vomiting   Associated symptoms: no abdominal pain, no chest pain, no cough, no ear pain, no fever, no rash and no sore throat   Neck Pain Associated symptoms: no chest pain and no fever    30 year old male presenting to the emergency department with multiple complaints.  He states that he fell from a ladder from a height of about 3 to 4 feet.  He was diagnosed on Sunday in the emergency department with a possible left tibial plateau fracture, placed in a leg immobilizer and referred to orthopedics outpatient.  He has been taking oxycodone at home with oral Zofran but has had issues with nausea and vomiting and pain control.  He has been able to keep down some of his pain medications.  He additionally complains of sharp stabbing pain in the right side of his neck.  He reported some tingling in his face and arms and was hyperventilating on arrival.  Past Medical History:  Diagnosis Date   Allergy    Asthma    Depression     There are no problems to display for this patient.   Past Surgical History:  Procedure Laterality Date   SEPTOPLASTY     TONSILLECTOMY         Family History  Problem Relation Age of Onset   Depression Mother    Arthritis Maternal Grandmother    Hypertension Maternal Grandmother    Stroke Maternal Grandmother    Hypertension Maternal Grandfather    Diabetes Maternal Grandfather    Arthritis Maternal Grandfather    Cancer Paternal Grandmother    Hearing loss Paternal Grandfather     Social History   Tobacco Use   Smoking status: Former    Packs/day: 0.50    Types: E-cigarettes, Cigarettes    Quit date: 06/08/2015    Years since quitting: 6.1    Smokeless tobacco: Never  Vaping Use   Vaping Use: Every day  Substance Use Topics   Alcohol use: Not Currently    Alcohol/week: 6.0 standard drinks    Types: 6 Shots of liquor per week   Drug use: No    Home Medications Prior to Admission medications   Medication Sig Start Date End Date Taking? Authorizing Provider  ALPRAZolam Prudy Feeler) 0.5 MG tablet Take 1 tablet (0.5 mg total) by mouth 2 (two) times daily as needed for anxiety. 02/17/21   Nafziger, Kandee Keen, NP  citalopram (CELEXA) 40 MG tablet Take 1 tablet (40 mg total) by mouth daily. 01/12/21   Nafziger, Kandee Keen, NP  magic mouthwash w/lidocaine SOLN Take 5 mLs by mouth 3 (three) times daily as needed. 01/12/21   Nafziger, Kandee Keen, NP  methylphenidate (RITALIN) 10 MG tablet Take 1 tablet (10 mg total) by mouth 2 (two) times daily. 08/04/21   Tomma Lightning, MD  Nirmatrelvir & Ritonavir (PAXLOVID) 20 x 150 MG & 10 x 100MG  TBPK Take 1 tablet by mouth in the morning and at bedtime. Take one dose by mouth twice daily for 5 days. 03/03/21   Burchette, 03/05/21, MD  ondansetron (ZOFRAN ODT) 8 MG disintegrating tablet 8mg  ODT q8 hours prn nausea 08/11/21   , MD  oxyCODONE-acetaminophen (PERCOCET) 5-325 MG tablet Take 1 tablet by mouth every 6 (six) hours as needed for up to 3 days. 08/11/21 08/14/21  Ernie Avena, MD  pantoprazole (PROTONIX) 20 MG tablet Take 1 tablet (20 mg total) by mouth daily. 06/25/19   Shirline Frees, NP    Allergies    Nsaids  Review of Systems   Review of Systems  Constitutional:  Negative for chills and fever.  HENT:  Negative for ear pain and sore throat.   Eyes:  Negative for pain and visual disturbance.  Respiratory:  Positive for shortness of breath. Negative for cough.   Cardiovascular:  Negative for chest pain and palpitations.  Gastrointestinal:  Positive for nausea and vomiting. Negative for abdominal pain.  Genitourinary:  Negative for dysuria and hematuria.  Musculoskeletal:  Positive for  arthralgias and neck pain. Negative for back pain.  Skin:  Negative for color change and rash.  Neurological:  Negative for seizures and syncope.       Paresthesias  All other systems reviewed and are negative.  Physical Exam Updated Vital Signs BP 132/85   Pulse 80   Temp 98.3 F (36.8 C) (Oral)   Resp 18   Ht 5\' 7"  (1.702 m)   Wt 88.5 kg   SpO2 99%   BMI 30.54 kg/m   Physical Exam Vitals and nursing note reviewed.  Constitutional:      Appearance: He is well-developed.  HENT:     Head: Normocephalic and atraumatic.  Eyes:     Conjunctiva/sclera: Conjunctivae normal.  Neck:     Comments: Mild midline tenderness to palpation of the cervical spine.  More significant right-sided tenderness to palpation of the soft tissues of the neck. Cardiovascular:     Rate and Rhythm: Normal rate and regular rhythm.     Heart sounds: No murmur heard. Pulmonary:     Effort: Pulmonary effort is normal. No respiratory distress.     Breath sounds: Normal breath sounds.  Abdominal:     Palpations: Abdomen is soft.     Tenderness: There is no abdominal tenderness.  Musculoskeletal:     Cervical back: Neck supple.     Comments: LLE leg binder in place. 2+ DP pulses, intact sensation to light touch  Skin:    General: Skin is warm and dry.  Neurological:     General: No focal deficit present.     Mental Status: He is alert and oriented to person, place, and time.     Cranial Nerves: No cranial nerve deficit.     Motor: No weakness.    ED Results / Procedures / Treatments   Labs (all labs ordered are listed, but only abnormal results are displayed) Labs Reviewed  CBG MONITORING, ED - Abnormal; Notable for the following components:      Result Value   Glucose-Capillary 134 (*)    All other components within normal limits    EKG None  Radiology CT Angio Neck W and/or Wo Contrast  Result Date: 08/11/2021 CLINICAL DATA:  Neck trauma, arterial injury suspected. Stabbing pain of  the posterior neck. Fell from a ladder. EXAM: CT ANGIOGRAPHY NECK TECHNIQUE: Multidetector CT imaging of the neck was performed using the standard protocol during bolus administration of intravenous contrast. Multiplanar CT image reconstructions and MIPs were obtained to evaluate the vascular anatomy. Carotid stenosis measurements (when applicable) are obtained utilizing NASCET criteria, using the distal internal carotid diameter as the denominator. CONTRAST:  56mL OMNIPAQUE IOHEXOL 350 MG/ML SOLN COMPARISON:  Cervical CT earlier same day. FINDINGS: Aortic arch: Normal Right carotid system: Normal. No evidence of dissection or intimal injury. Left carotid system: Normal. No evidence of dissection or intimal injury. Vertebral arteries: Normal. No evidence of dissection or intimal injury. Skeleton: No traumatic finding. No degenerative changes. Minimal scoliotic curvature. Other neck: No soft tissue injury identified. Upper chest: Normal.  No traumatic finding. IMPRESSION: Normal CT angiography. No vascular pathology identified. No evidence of regional fracture or soft tissue injury. Mild scoliotic curvature of the spine. Electronically Signed   By: Paulina Fusi M.D.   On: 08/11/2021 10:31   CT Cervical Spine Wo Contrast  Result Date: 08/11/2021 CLINICAL DATA:  Neck trauma, pain in back of neck EXAM: CT CERVICAL SPINE WITHOUT CONTRAST TECHNIQUE: Multidetector CT imaging of the cervical spine was performed without intravenous contrast. Multiplanar CT image reconstructions were also generated. COMPARISON:  None. FINDINGS: Alignment: Normal. Skull base and vertebrae: No acute fracture. No primary bone lesion or focal pathologic process. Soft tissues and spinal canal: No prevertebral fluid or swelling. No visible canal hematoma. Disc levels:  Intact. Upper chest: Negative. Other: None. IMPRESSION: No fracture or static subluxation of the cervical spine. Disc spaces and vertebral body heights are preserved.  Electronically Signed   By: Jearld Lesch M.D.   On: 08/11/2021 09:51    Procedures Procedures   Medications Ordered in ED Medications  HYDROcodone-acetaminophen (NORCO/VICODIN) 5-325 MG per tablet 1 tablet (1 tablet Oral Given 08/11/21 1117)  lactated ringers bolus 1,000 mL ( Intravenous Stopped 08/11/21 1057)  ondansetron (ZOFRAN) injection 4 mg (4 mg Intravenous Given 08/11/21 1001)  alum & mag hydroxide-simeth (MAALOX/MYLANTA) 200-200-20 MG/5ML suspension 30 mL (30 mLs Oral Given 08/11/21 1200)    And  lidocaine (XYLOCAINE) 2 % viscous mouth solution 15 mL (15 mLs Oral Given 08/11/21 1200)  iohexol (OMNIPAQUE) 350 MG/ML injection 100 mL (75 mLs Intravenous Contrast Given 08/11/21 1013)    ED Course  I have reviewed the triage vital signs and the nursing notes.  Pertinent labs & imaging results that were available during my care of the patient were reviewed by me and considered in my medical decision making (see chart for details).    MDM Rules/Calculators/A&P                           30 year old male presenting to the emergency department with multiple complaints.  He states that he fell from a ladder from a height of about 3 to 4 feet.  He was diagnosed on Sunday in the emergency department with a possible left tibial plateau fracture, placed in a leg immobilizer and referred to orthopedics outpatient.  He has been taking oxycodone at home with oral Zofran but has had issues with nausea and vomiting and pain control.  He has been able to keep down some of his pain medications.  He additionally complains of sharp stabbing pain in the right side of his neck.  He reported some tingling in his face and arms and was hyperventilating on arrival.  On arrival, the patient was afebrile, mildly hypertensive, otherwise stable.  Some midline tenderness palpation of his neck along the cervical spine in the setting of recent fall, will obtain CT of the neck/cervical spine and CTA to evaluate for  possible vertebral dissection versus fracture.  CTA imaging negative for acute fracture, malalignment, arterial abnormality.  The patient had some paresthesias and was hyperventilating on arrival consistent with likely panic attack.  This resolved with pain control.  The patient was tolerating oral intake and appears well-hydrated.  He has had some nausea and vomiting in the setting of his home opiate prescription.  His nausea resolved with IV Zofran.  He was administered an oral opiate with adequate pain control.  He plans to follow-up with orthopedics regarding his possible tibial plateau fracture outpatient.  Overall stable for continued pain management at home.  Final Clinical Impression(s) / ED Diagnoses Final diagnoses:  Strain of neck muscle, initial encounter  Nausea and vomiting, unspecified vomiting type  Anxiety  Pain of left lower extremity    Rx / DC Orders ED Discharge Orders          Ordered    ondansetron (ZOFRAN ODT) 8 MG disintegrating tablet  Status:  Discontinued        08/11/21 1140    oxyCODONE-acetaminophen (PERCOCET) 5-325 MG tablet  Every 6 hours PRN,   Status:  Discontinued        08/11/21 1140    ondansetron (ZOFRAN ODT) 8 MG disintegrating tablet        08/11/21 1223    oxyCODONE-acetaminophen (PERCOCET) 5-325 MG tablet  Every 6 hours PRN        08/11/21 1223             Ernie Avena, MD 08/11/21 [place]

## 2021-08-11 NOTE — Discharge Instructions (Addendum)
Please follow-up with orthopedics outpatient given your findings on x-ray during her last ED visit.  Continue your home opiates and nausea medication.  Your CT imaging of your neck was negative today.

## 2021-08-17 ENCOUNTER — Encounter: Payer: Self-pay | Admitting: Adult Health

## 2021-08-17 ENCOUNTER — Other Ambulatory Visit: Payer: Self-pay | Admitting: Adult Health

## 2021-08-17 MED ORDER — OXYCODONE-ACETAMINOPHEN 5-325 MG PO TABS: 1.0000 | ORAL_TABLET | Freq: Four times a day (QID) | ORAL | 0 refills | Status: AC | PRN

## 2021-08-23 ENCOUNTER — Ambulatory Visit: Payer: Self-pay

## 2021-08-23 ENCOUNTER — Encounter: Payer: Self-pay | Admitting: Physician Assistant

## 2021-08-23 ENCOUNTER — Ambulatory Visit: Payer: PRIVATE HEALTH INSURANCE | Admitting: Orthopaedic Surgery

## 2021-08-23 ENCOUNTER — Ambulatory Visit (INDEPENDENT_AMBULATORY_CARE_PROVIDER_SITE_OTHER): Payer: PRIVATE HEALTH INSURANCE | Admitting: Physician Assistant

## 2021-08-23 DIAGNOSIS — S82142D Displaced bicondylar fracture of left tibia, subsequent encounter for closed fracture with routine healing: Secondary | ICD-10-CM

## 2021-08-23 DIAGNOSIS — M25562 Pain in left knee: Secondary | ICD-10-CM

## 2021-08-23 MED ORDER — ACETAMINOPHEN-CODEINE #3 300-30 MG PO TABS: 1.0000 | ORAL_TABLET | ORAL | 0 refills | Status: DC | PRN

## 2021-08-23 NOTE — Progress Notes (Signed)
Office Visit Note   Patient: Cory Vazquez           Date of Birth: 02-03-1991           MRN: [id number] Visit Date: 08/23/2021              Requested by: Shirline Frees, NP [address] [place],  [place] [address] PCP: Shirline Frees, NP   Assessment & Plan: Visit Diagnoses:  1. Left knee pain, unspecified chronicity   2. Closed fracture of left tibial plateau with routine healing, subsequent encounter     Plan: He is reminded to be nonweightbearing with the use of crutches.  He can start weightbearing as tolerated with the knee immobilizer in 2 weeks.  No high impact activities.  Was given Tylenol 3 for pain.  Follow-Up Instructions: Return in about 4 weeks (around 09/20/2021) for Radiographs left knee complete 4 views.   Orders:  Orders Placed This Encounter  Procedures   XR Knee Complete 4 Views Left   Meds ordered this encounter  Medications   acetaminophen-codeine (TYLENOL #3) 300-30 MG tablet    Sig: Take 1-2 tablets by mouth every 4 (four) hours as needed for moderate pain.    Dispense:  30 tablet    Refill:  0      Procedures: No procedures performed   Clinical Data: No additional findings.   Subjective: Chief Complaint  Patient presents with   Left Knee - Pain    HPI Carlon follow-up of her left knee lateral tibial plateau fracture.  He sustained a fall on 08/08/2021 half of the ladder was seen in the ER where he was found to have a nondisplaced lateral tibial plateau fracture.  I reviewed the films and also the CT scan of the left lower extremity.  He was placed in a knee immobilizer told to ambulate with crutches unfortunately he started weightbearing on the leg with the knee brace on over the last few days.  He had a fall last night in his garage and fell onto the knee he has had increased pain since that time.  Review of Systems See HPI  Objective: Vital Signs: There were no vitals taken for this visit.  Physical Exam General  well-developed well-nourished male no acute distress mood affect appropriate Ortho Exam Left knee full extension and flexion 90 degrees.  Positive effusion.  No abnormal warmth erythema.  No gross instability valgus varus stress. Specialty Comments:  No specialty comments available.  Imaging: XR Knee Complete 4 Views Left  Result Date: 08/23/2021 Left knee 4 views: Knee is well located.  Oblique view the lateral tibial plateau fracture appears unchanged in overall position alignment.  No significant consolidation.    PMFS History: There are no problems to display for this patient.  Past Medical History:  Diagnosis Date   Allergy    Asthma    Depression     Family History  Problem Relation Age of Onset   Depression Mother    Arthritis Maternal Grandmother    Hypertension Maternal Grandmother    Stroke Maternal Grandmother    Hypertension Maternal Grandfather    Diabetes Maternal Grandfather    Arthritis Maternal Grandfather    Cancer Paternal Grandmother    Hearing loss Paternal Grandfather     Past Surgical History:  Procedure Laterality Date   SEPTOPLASTY     TONSILLECTOMY     Social History   Occupational History   Not on file  Tobacco Use  Smoking status: Former    Packs/day: 0.50    Types: E-cigarettes, Cigarettes    Quit date: 06/08/2015    Years since quitting: 6.2   Smokeless tobacco: Never  Vaping Use   Vaping Use: Every day  Substance and Sexual Activity   Alcohol use: Not Currently    Alcohol/week: 6.0 standard drinks    Types: 6 Shots of liquor per week   Drug use: No   Sexual activity: Yes

## 2021-09-01 ENCOUNTER — Encounter: Payer: Self-pay | Admitting: Adult Health

## 2021-09-01 ENCOUNTER — Other Ambulatory Visit: Payer: Self-pay

## 2021-09-01 ENCOUNTER — Encounter (HOSPITAL_BASED_OUTPATIENT_CLINIC_OR_DEPARTMENT_OTHER): Payer: Self-pay | Admitting: Emergency Medicine

## 2021-09-01 ENCOUNTER — Emergency Department (HOSPITAL_BASED_OUTPATIENT_CLINIC_OR_DEPARTMENT_OTHER)
Admission: EM | Admit: 2021-09-01 | Discharge: 2021-09-02 | Disposition: A | Discharge status: Home / Self Care | Payer: PRIVATE HEALTH INSURANCE | Attending: Emergency Medicine | Admitting: Emergency Medicine

## 2021-09-01 DIAGNOSIS — R112 Nausea with vomiting, unspecified: Secondary | ICD-10-CM | POA: Insufficient documentation

## 2021-09-01 DIAGNOSIS — G43909 Migraine, unspecified, not intractable, without status migrainosus: Secondary | ICD-10-CM | POA: Insufficient documentation

## 2021-09-01 DIAGNOSIS — Z87891 Personal history of nicotine dependence: Secondary | ICD-10-CM | POA: Insufficient documentation

## 2021-09-01 DIAGNOSIS — E860 Dehydration: Secondary | ICD-10-CM | POA: Insufficient documentation

## 2021-09-01 DIAGNOSIS — R519 Headache, unspecified: Secondary | ICD-10-CM | POA: Insufficient documentation

## 2021-09-01 MED ORDER — LACTATED RINGERS IV BOLUS
1000.0000 mL | Freq: Once | INTRAVENOUS | Status: AC
  Administered 2021-09-01: 1000 mL via INTRAVENOUS

## 2021-09-01 MED ORDER — DEXAMETHASONE SODIUM PHOSPHATE 10 MG/ML IJ SOLN
10.0000 mg | Freq: Once | INTRAMUSCULAR | Status: AC
  Administered 2021-09-02: 10 mg via INTRAVENOUS
  Filled 2021-09-01: qty 1

## 2021-09-01 MED ORDER — DIPHENHYDRAMINE HCL 50 MG/ML IJ SOLN
25.0000 mg | Freq: Once | INTRAMUSCULAR | Status: AC
  Administered 2021-09-02: 25 mg via INTRAVENOUS
  Filled 2021-09-01: qty 1

## 2021-09-01 MED ORDER — PROMETHAZINE HCL 25 MG/ML IJ SOLN
INTRAMUSCULAR | Status: AC
  Filled 2021-09-01: qty 1

## 2021-09-01 MED ORDER — SODIUM CHLORIDE 0.9 % IV SOLN
12.5000 mg | Freq: Four times a day (QID) | INTRAVENOUS | Status: DC | PRN
  Administered 2021-09-02: 12.5 mg via INTRAVENOUS
  Filled 2021-09-01: qty 0.5

## 2021-09-01 NOTE — Telephone Encounter (Signed)
Pt has been scheduled for appt °

## 2021-09-01 NOTE — ED Triage Notes (Signed)
Pt presents to ED POV. Pt c/o migraines, emesis since Tuesday morning. Pt reports taking zofran at home w/o relief.

## 2021-09-01 NOTE — ED Provider Notes (Signed)
[facility] EMERGENCY DEPT Provider Note   CSN: [id number] Arrival date & time: 09/01/21  2006     History Chief Complaint  Patient presents with   Migraine    Cory Vazquez is a 30 y.o. male.  30 year old male whose had intermittent episodes of vomiting and diarrhea over the last few months.  He has had this time since Monday.  States that has not really gotten any better.  Nuys any headache associated with it.  No visual or other neurologic changes.  Decreased urination.  No other urinary symptoms.  No sick contacts.  No fevers.  Has some abdominal cramping but no focal or severe pain.   Migraine      Past Medical History:  Diagnosis Date   Allergy    Asthma    Depression     There are no problems to display for this patient.   Past Surgical History:  Procedure Laterality Date   SEPTOPLASTY     TONSILLECTOMY         Family History  Problem Relation Age of Onset   Depression Mother    Arthritis Maternal Grandmother    Hypertension Maternal Grandmother    Stroke Maternal Grandmother    Hypertension Maternal Grandfather    Diabetes Maternal Grandfather    Arthritis Maternal Grandfather    Cancer Paternal Grandmother    Hearing loss Paternal Grandfather     Social History   Tobacco Use   Smoking status: Former    Packs/day: 0.50    Types: E-cigarettes, Cigarettes    Quit date: 06/08/2015    Years since quitting: 6.2   Smokeless tobacco: Never  Vaping Use   Vaping Use: Every day  Substance Use Topics   Alcohol use: Not Currently    Alcohol/week: 6.0 standard drinks    Types: 6 Shots of liquor per week   Drug use: No    Home Medications Prior to Admission medications   Medication Sig Start Date End Date Taking? Authorizing Provider  promethazine (PHENERGAN) 25 MG tablet Take 1 tablet (25 mg total) by mouth every 6 (six) hours as needed for nausea or vomiting. 09/02/21  Yes Jemario Poitras, Barbara Cower, MD  acetaminophen-codeine (TYLENOL #3)  300-30 MG tablet Take 1-2 tablets by mouth every 4 (four) hours as needed for moderate pain. 08/23/21   Kirtland Bouchard, PA-C  ALPRAZolam Prudy Feeler) 0.5 MG tablet Take 1 tablet (0.5 mg total) by mouth 2 (two) times daily as needed for anxiety. 02/17/21   Nafziger, Kandee Keen, NP  citalopram (CELEXA) 40 MG tablet Take 1 tablet (40 mg total) by mouth daily. 01/12/21   Nafziger, Kandee Keen, NP  magic mouthwash w/lidocaine SOLN Take 5 mLs by mouth 3 (three) times daily as needed. 01/12/21   Nafziger, Kandee Keen, NP  methylphenidate (RITALIN) 10 MG tablet Take 1 tablet (10 mg total) by mouth 2 (two) times daily. 08/04/21   Tomma Lightning, MD  Nirmatrelvir & Ritonavir (PAXLOVID) 20 x 150 MG & 10 x 100MG  TBPK Take 1 tablet by mouth in the morning and at bedtime. Take one dose by mouth twice daily for 5 days. 03/03/21   Burchette, 03/05/21, MD  ondansetron (ZOFRAN ODT) 8 MG disintegrating tablet 8mg  ODT q8 hours prn nausea 08/11/21   , MD  pantoprazole (PROTONIX) 20 MG tablet Take 1 tablet (20 mg total) by mouth daily. 06/25/19   Ernie Avena, NP    Allergies    Nsaids  Review of Systems   Review of Systems  All other systems reviewed and are negative.  Physical Exam Updated Vital Signs BP 120/82   Pulse 73   Temp 98 F (36.7 C) (Oral)   Resp 14   Ht 5\' 8"  (1.727 m)   Wt 88.5 kg   SpO2 98%   BMI 29.65 kg/m   Physical Exam Vitals and nursing note reviewed.  Constitutional:      Appearance: He is well-developed.  HENT:     Head: Normocephalic and atraumatic.     Mouth/Throat:     Mouth: Mucous membranes are moist.     Pharynx: Oropharynx is clear.  Eyes:     Pupils: Pupils are equal, round, and reactive to light.  Cardiovascular:     Rate and Rhythm: Normal rate.  Pulmonary:     Effort: Pulmonary effort is normal. No respiratory distress.  Abdominal:     General: Abdomen is flat. There is no distension.  Musculoskeletal:        General: Normal range of motion.     Cervical back: Normal  range of motion.  Skin:    General: Skin is warm and dry.  Neurological:     General: No focal deficit present.     Mental Status: He is alert.    ED Results / Procedures / Treatments   Labs (all labs ordered are listed, but only abnormal results are displayed) Labs Reviewed  BASIC METABOLIC PANEL - Abnormal; Notable for the following components:      Result Value   Glucose, Bld 104 (*)    Creatinine, Ser 1.26 (*)    All other components within normal limits  CBC WITH DIFFERENTIAL/PLATELET    EKG None  Radiology No results found.  Procedures Procedures   Medications Ordered in ED Medications  promethazine (PHENERGAN) 12.5 mg in sodium chloride 0.9 % 50 mL IVPB (0 mg Intravenous Stopped 09/02/21 0053)  promethazine (PHENERGAN) 25 MG/ML injection (  Not Given 09/02/21 0004)  lactated ringers bolus 1,000 mL (0 mLs Intravenous Stopped 09/02/21 0053)  dexamethasone (DECADRON) injection 10 mg (10 mg Intravenous Given 09/02/21 0001)  diphenhydrAMINE (BENADRYL) injection 25 mg (25 mg Intravenous Given 09/02/21 0000)    ED Course  I have reviewed the triage vital signs and the nursing notes.  Pertinent labs & imaging results that were available during my care of the patient were reviewed by me and considered in my medical decision making (see chart for details).    MDM Rules/Calculators/A&P                         Patient given fluids and antiemetics which also helped his headache.  I suspect a lot of is probably dehydration rather than a true migraine.  Either way has follow-up with his primary doctor today where he can discuss getting referred to gastroenterology.  Stable for discharge at this time  Final Clinical Impression(s) / ED Diagnoses Final diagnoses:  Nausea and vomiting, unspecified vomiting type  Nonintractable headache, unspecified chronicity pattern, unspecified headache type  Dehydration    Rx / DC Orders ED Discharge Orders          Ordered     promethazine (PHENERGAN) 25 MG tablet  Every 6 hours PRN        09/02/21 0057             Alonda Weaber, 09/04/21, MD 09/02/21 0502

## 2021-09-02 ENCOUNTER — Encounter: Payer: Self-pay | Admitting: Family Medicine

## 2021-09-02 ENCOUNTER — Telehealth: Payer: Self-pay | Admitting: Adult Health

## 2021-09-02 ENCOUNTER — Telehealth (INDEPENDENT_AMBULATORY_CARE_PROVIDER_SITE_OTHER): Payer: PRIVATE HEALTH INSURANCE | Admitting: Family Medicine

## 2021-09-02 DIAGNOSIS — R112 Nausea with vomiting, unspecified: Secondary | ICD-10-CM

## 2021-09-02 DIAGNOSIS — R197 Diarrhea, unspecified: Secondary | ICD-10-CM

## 2021-09-02 LAB — CBC WITH DIFFERENTIAL/PLATELET
Abs Immature Granulocytes: 0.02 10*3/uL (ref 0.00–0.07)
Basophils Absolute: 0 10*3/uL (ref 0.0–0.1)
Basophils Relative: 1 %
Eosinophils Absolute: 0.1 10*3/uL (ref 0.0–0.5)
Eosinophils Relative: 1 %
HCT: 47.3 % (ref 39.0–52.0)
Hemoglobin: 16.4 g/dL (ref 13.0–17.0)
Immature Granulocytes: 0 %
Lymphocytes Relative: 23 %
Lymphs Abs: 2 10*3/uL (ref 0.7–4.0)
MCH: 29.3 pg (ref 26.0–34.0)
MCHC: 34.7 g/dL (ref 30.0–36.0)
MCV: 84.5 fL (ref 80.0–100.0)
Monocytes Absolute: 0.6 10*3/uL (ref 0.1–1.0)
Monocytes Relative: 7 %
Neutro Abs: 5.9 10*3/uL (ref 1.7–7.7)
Neutrophils Relative %: 68 %
Platelets: 340 10*3/uL (ref 150–400)
RBC: 5.6 MIL/uL (ref 4.22–5.81)
RDW: 13 % (ref 11.5–15.5)
WBC: 8.6 10*3/uL (ref 4.0–10.5)
nRBC: 0 % (ref 0.0–0.2)

## 2021-09-02 LAB — BASIC METABOLIC PANEL
Anion gap: 10 (ref 5–15)
BUN: 14 mg/dL (ref 6–20)
CO2: 27 mmol/L (ref 22–32)
Calcium: 10 mg/dL (ref 8.9–10.3)
Chloride: 105 mmol/L (ref 98–111)
Creatinine, Ser: 1.26 mg/dL — ABNORMAL HIGH (ref 0.61–1.24)
GFR, Estimated: >60 mL/min (ref >60)
Glucose, Bld: 104 mg/dL — ABNORMAL HIGH (ref 70–99)
Potassium: 3.6 mmol/L (ref 3.5–5.1)
Sodium: 142 mmol/L (ref 135–145)

## 2021-09-02 MED ORDER — PROMETHAZINE HCL 25 MG PO TABS: 25.0000 mg | ORAL_TABLET | Freq: Four times a day (QID) | ORAL | 0 refills | Status: DC | PRN

## 2021-09-02 NOTE — ED Notes (Signed)
Pt d/c at 2am during downtime.   Verbal confirmation that he understood d/c instructions.   VS: 98.4 76 16 136/88 4/10

## 2021-09-02 NOTE — Patient Instructions (Signed)
   ---------------------------------------------------------------------------------------------------------------------------      WORK SLIP:  Patient Cory Vazquez,  May 04, 1991, was seen for a medical visit today, 09/02/21 . Please excuse from work until diarrhea resolved for 24 hours.   Sincerely: E-signature: Dr. Kriste Basque, DO [facility] Ph: [phone]   ------------------------------------------------------------------------------------------------------------------------------   -imodium if needed for diarrhea  -zofran as needed per instructions for nausea/vomiting  -drink plenty of fluids  -cut out dairy  -start nexium once daily  -follow up with your primary care provider in the next 1-2 weeks in the office - will need to have a negative covid test. Please test at home or they may have you come 30 minutes early to do a covid test before the visit.     I hope you are feeling better soon!  Seek in person care promptly if your symptoms worsen, new concerns arise or you are not improving with treatment.  It was nice to meet you today. I help [facility] out with telemedicine visits on Tuesdays and Thursdays and am available for visits on those days. If you have any concerns or questions following this visit please schedule a follow up visit with your Primary Care doctor or seek care at a local urgent care clinic to avoid delays in care.

## 2021-09-02 NOTE — Telephone Encounter (Signed)
Dr. Selena Batten wanted patient to follow up with in office visit following his virtual appointment from today.  Called patient to get him scheduled. No answer, left voicemail message.  Schedule patient for an in office visit with Kandee Keen if he calls back.

## 2021-09-02 NOTE — Progress Notes (Signed)
Virtual Visit via Video Note  I connected with Cory Vazquez  on 09/02/21 at 10:00 AM EST by a video enabled telemedicine application and verified that I am speaking with the correct person using two identifiers.  Location patient: home,  Location provider:work or home office Persons participating in the virtual visit: patient, provider  I discussed the limitations of evaluation and management by telemedicine and the availability of in person appointments. The patient expressed understanding and agreed to proceed.   HPI:  Acute telemedicine visit for nausea and vomiting, diarrhea: -Onset:2 days ago -Symptoms include: nausea, vomiting, watery diarrhea, headache  -diarrhea and vomiting improved today -but for about 6 months has been throwing up 1-2 times pretty much every day, feels nausea most of the time, intermittent diarrhea, some vague abd discomfort too -Denies:fevers, weight loss, melena, hematochezia, resp symptoms -denies family hx GI disorders -drugs/alcohol: stopped smoking weed 1 week ago (he tried stopping weed for 4-5 months and it did not help) -Pertinent past medical history: see below -Pertinent medication allergies: Allergies  Allergen Reactions   Nsaids Anaphylaxis  -COVID-19: had 2 vaccines and had covid this summer - but symptoms started prior to covid infection  ROS: See pertinent positives and negatives per HPI.  Past Medical History:  Diagnosis Date   Allergy    Asthma    Depression     Past Surgical History:  Procedure Laterality Date   SEPTOPLASTY     TONSILLECTOMY       Current Outpatient Medications:    acetaminophen-codeine (TYLENOL #3) 300-30 MG tablet, Take 1-2 tablets by mouth every 4 (four) hours as needed for moderate pain., Disp: 30 tablet, Rfl: 0   methylphenidate (RITALIN) 10 MG tablet, Take 1 tablet (10 mg total) by mouth 2 (two) times daily., Disp: 60 tablet, Rfl: 0   ondansetron (ZOFRAN ODT) 8 MG disintegrating tablet, 8mg  ODT q8 hours prn  nausea, Disp: 12 tablet, Rfl: 0   promethazine (PHENERGAN) 25 MG tablet, Take 1 tablet (25 mg total) by mouth every 6 (six) hours as needed for nausea or vomiting., Disp: 30 tablet, Rfl: 0  EXAM:  VITALS per patient if applicable:  GENERAL: alert, oriented, appears well and in no acute distress  HEENT: atraumatic, conjunttiva clear, no obvious abnormalities on inspection of external nose and ears  NECK: normal movements of the head and neck  LUNGS: on inspection no signs of respiratory distress, breathing rate appears normal, no obvious gross SOB, gasping or wheezing  CV: no obvious cyanosis  MS: moves all visible extremities without noticeable abnormality  PSYCH/NEURO: pleasant and cooperative, no obvious depression or anxiety, speech and thought processing grossly intact  ASSESSMENT AND PLAN:  Discussed the following assessment and plan:  Nausea and vomiting, unspecified vomiting type  Diarrhea, unspecified type  -we discussed possible serious and likely etiologies, options for evaluation and workup, limitations of telemedicine visit vs in person visit, treatment, treatment risks and precautions. Pt is agreeable to treatment via telemedicine at this moment. Current symptoms could be acute viral illness vs other; has underlying reported chronic issues as well. For acute issues advised zofran (he has plenty per his report), imodium, oral hydration, avoidance of dairy. For chronic issues will need inperson evaluation with exam, labs, imaging, referral pending exam. Sent message to PCP office and PCP to schedule inperson visit and also offered to place GI referral in the interim. Patient did want inperson eval with PCP. Advised trial nexium in interim as well.  Work/School slipped offered: provided in patient  instructions   Scheduled follow up with PCP offered:  Sent message to schedulers to assist and advised patient to contact PCP office to schedule if does not receive call back in  next 24 hours. Advised to seek prompt in person care if worsening, new symptoms arise, or if is not improving with treatment. Discussed options for inperson care if PCP office not available. Did let this patient know that I only do telemedicine on Tuesdays and Thursdays for [place]. Advised to schedule follow up visit with PCP or [facility] if any further questions or concerns to avoid delays in care.   I discussed the assessment and treatment plan with the patient. The patient was provided an opportunity to ask questions and all were answered. The patient agreed with the plan and demonstrated an understanding of the instructions.     Terressa Koyanagi, DO

## 2021-09-07 ENCOUNTER — Encounter: Payer: Self-pay | Admitting: Adult Health

## 2021-09-07 ENCOUNTER — Ambulatory Visit (INDEPENDENT_AMBULATORY_CARE_PROVIDER_SITE_OTHER): Payer: Self-pay | Admitting: Adult Health

## 2021-09-07 VITALS — BP 120/70 | HR 83 | Temp 98.7°F | Ht 68.0 in | Wt 192.0 lb

## 2021-09-07 DIAGNOSIS — R112 Nausea with vomiting, unspecified: Secondary | ICD-10-CM

## 2021-09-07 DIAGNOSIS — R519 Headache, unspecified: Secondary | ICD-10-CM

## 2021-09-07 MED ORDER — PANTOPRAZOLE SODIUM 40 MG PO TBEC: 40.0000 mg | DELAYED_RELEASE_TABLET | Freq: Every day | ORAL | 0 refills | Status: DC

## 2021-09-07 NOTE — Progress Notes (Signed)
Subjective:    Patient ID: Cory Vazquez, male    DOB: 04-Mar-1991, 30 y.o.   MRN: [id number]  HPI 30 year old male who  has a past medical history of Allergy, Asthma, and Depression.  Was seen in the emergency room on 09/01/2021 with intermittent episodes of vomiting and diarrhea over the past few months.  He did report some abdominal cramping but no focal or severe pain.  Also had a headache.  He was given IV fluids and antiemetics which helped with the nausea, vomiting, and headache.  Suspect that his headache was probably due to dehydration.  Then had a follow-up visit virtually with another provider who recommended that he follow-up with his PCP but start Nexium in the meantime.  Today on follow-up he reports that over the last 6 to 12 months he has had vomiting episodes every single morning, he throws up at least 2 times despite taking an antiemetic.  Not start the PPI.  Sometimes the vomiting continues into the late morning/early afternoon but he does not have any vomiting in the afternoon into evening.  Has stopped smoking marijuana and his symptoms continue.  At times he has feels as though he is feverish and has chills.  Generally over the last year to 6 months he is also developed frequent but remittent headaches.  Headaches are usually behind the left eye and to the left temple but sometimes they can be on the right temple as well.  Usually takes Tylenol which sometimes relieves some of the pain.  Denies blurred vision or other neurological deficits  Review of Systems See HPI   Past Medical History:  Diagnosis Date   Allergy    Asthma    Depression     Social History   Socioeconomic History   Marital status: Married    Spouse name: Not on file   Number of children: 1   Years of education: 42   Highest education level: High school graduate  Occupational History   Not on file  Tobacco Use   Smoking status: Former    Packs/day: 0.50    Types: E-cigarettes,  Cigarettes    Quit date: 06/08/2015    Years since quitting: 6.2   Smokeless tobacco: Never  Vaping Use   Vaping Use: Every day  Substance and Sexual Activity   Alcohol use: Not Currently    Alcohol/week: 6.0 standard drinks    Types: 6 Shots of liquor per week   Drug use: No   Sexual activity: Yes  Other Topics Concern   Not on file  Social History Narrative   Not on file   Social Determinants of Health   Financial Resource Strain: Not on file  Food Insecurity: Not on file  Transportation Needs: Not on file  Physical Activity: Not on file  Stress: Not on file  Social Connections: Not on file  Intimate Partner Violence: Not on file    Past Surgical History:  Procedure Laterality Date   SEPTOPLASTY     TONSILLECTOMY      Family History  Problem Relation Age of Onset   Depression Mother    Arthritis Maternal Grandmother    Hypertension Maternal Grandmother    Stroke Maternal Grandmother    Hypertension Maternal Grandfather    Diabetes Maternal Grandfather    Arthritis Maternal Grandfather    Cancer Paternal Grandmother    Hearing loss Paternal Grandfather     Allergies  Allergen Reactions   Nsaids Anaphylaxis  Current Outpatient Medications on File Prior to Visit  Medication Sig Dispense Refill   acetaminophen-codeine (TYLENOL #3) 300-30 MG tablet Take 1-2 tablets by mouth every 4 (four) hours as needed for moderate pain. 30 tablet 0   gabapentin (NEURONTIN) 100 MG capsule Take 100 mg by mouth daily.     methylphenidate (RITALIN) 10 MG tablet Take 1 tablet (10 mg total) by mouth 2 (two) times daily. 60 tablet 0   ondansetron (ZOFRAN ODT) 8 MG disintegrating tablet 8mg  ODT q8 hours prn nausea 12 tablet 0   promethazine (PHENERGAN) 25 MG tablet Take 1 tablet (25 mg total) by mouth every 6 (six) hours as needed for nausea or vomiting. 30 tablet 0   sertraline (ZOLOFT) 50 MG tablet Take 50 mg by mouth daily.     No current facility-administered medications on  file prior to visit.    BP 120/70   Pulse 83   Temp 98.7 F (37.1 C) (Oral)   Ht 5\' 8"  (1.727 m)   Wt 192 lb (87.1 kg)   SpO2 97%   BMI 29.19 kg/m       Objective:   Physical Exam Vitals and nursing note reviewed.  Constitutional:      Appearance: Normal appearance.  Cardiovascular:     Rate and Rhythm: Normal rate and regular rhythm.     Pulses: Normal pulses.     Heart sounds: Normal heart sounds.  Pulmonary:     Effort: Pulmonary effort is normal.     Breath sounds: Normal breath sounds.  Abdominal:     General: Abdomen is flat.     Palpations: Abdomen is soft.  Musculoskeletal:        General: Normal range of motion.  Skin:    General: Skin is warm and dry.     Capillary Refill: Capillary refill takes less than 2 seconds.  Neurological:     Mental Status: He is alert.  Psychiatric:        Mood and Affect: Mood normal.        Behavior: Behavior normal.        Thought Content: Thought content normal.        Judgment: Judgment normal.       Assessment & Plan:  1. Nausea and vomiting, unspecified vomiting type -Started on Protonix and sent to gastroenterology.  We will also order MRI of the brain to rule out issue causing increased intracranial pressure. - pantoprazole (PROTONIX) 40 MG tablet; Take 1 tablet (40 mg total) by mouth daily.  Dispense: 30 tablet; Refill: 0 - Ambulatory referral to Gastroenterology - MR Brain Wo Contrast; Future  2. Acute intractable headache, unspecified headache type  - MR Brain Wo Contrast; Future  , NP

## 2021-09-20 ENCOUNTER — Other Ambulatory Visit: Payer: Self-pay | Admitting: Pulmonary Disease

## 2021-09-22 NOTE — Telephone Encounter (Signed)
Hi Dr Wynona Neat, are you able to refill pts Ritalin? Thank you.

## 2021-09-23 ENCOUNTER — Other Ambulatory Visit: Payer: Self-pay | Admitting: Pulmonary Disease

## 2021-09-24 MED ORDER — METHYLPHENIDATE HCL 10 MG PO TABS: 10.0000 mg | ORAL_TABLET | Freq: Two times a day (BID) | ORAL | 0 refills | Status: DC

## 2021-09-24 NOTE — Telephone Encounter (Signed)
Ritalin refilled

## 2021-09-28 ENCOUNTER — Other Ambulatory Visit: Payer: Self-pay | Admitting: Adult Health

## 2021-09-28 DIAGNOSIS — R112 Nausea with vomiting, unspecified: Secondary | ICD-10-CM

## 2021-11-29 ENCOUNTER — Other Ambulatory Visit (HOSPITAL_COMMUNITY): Payer: Self-pay

## 2021-11-29 ENCOUNTER — Other Ambulatory Visit: Payer: Self-pay | Admitting: Pulmonary Disease

## 2021-11-29 MED ORDER — METHYLPHENIDATE HCL 10 MG PO TABS
10.0000 mg | ORAL_TABLET | Freq: Two times a day (BID) | ORAL | 0 refills | Status: DC
  Filled 2021-11-29 – 2021-12-11 (×2): qty 60, 30d supply, fill #0

## 2021-11-29 NOTE — Telephone Encounter (Signed)
Pt is a Dr. Ander Slade pt. With  Dr. Jenetta Downer being out of the office, routing this to provider of the day. Dr. Silas Flood, please advise if you are okay refilling med for pt in Dr. Colman Cater place.

## 2021-11-30 ENCOUNTER — Other Ambulatory Visit (HOSPITAL_COMMUNITY): Payer: Self-pay

## 2021-12-02 ENCOUNTER — Other Ambulatory Visit: Payer: Self-pay

## 2021-12-02 ENCOUNTER — Encounter: Payer: Self-pay | Admitting: Adult Health

## 2021-12-02 ENCOUNTER — Telehealth: Payer: Self-pay | Admitting: Adult Health

## 2021-12-02 DIAGNOSIS — R112 Nausea with vomiting, unspecified: Secondary | ICD-10-CM

## 2021-12-02 MED ORDER — PANTOPRAZOLE SODIUM 40 MG PO TBEC: 40.0000 mg | DELAYED_RELEASE_TABLET | Freq: Every day | ORAL | 0 refills | Status: DC

## 2021-12-02 NOTE — Telephone Encounter (Signed)
Spoke to pt to see if he saw GI or had the MRI done. Pt stated that he has not had either one done. Pt stated that he has been taking some antiacid OTC for relief that helping a little. Pt is wanting medication refill and note for work. Pt claims that the sx happens more in the morning times.

## 2021-12-02 NOTE — Telephone Encounter (Signed)
Noted  

## 2021-12-07 ENCOUNTER — Other Ambulatory Visit (HOSPITAL_COMMUNITY): Payer: Self-pay

## 2021-12-10 ENCOUNTER — Encounter: Payer: Self-pay | Admitting: Gastroenterology

## 2021-12-11 ENCOUNTER — Other Ambulatory Visit (HOSPITAL_COMMUNITY): Payer: Self-pay

## 2021-12-15 ENCOUNTER — Other Ambulatory Visit (HOSPITAL_COMMUNITY): Payer: Self-pay

## 2021-12-20 ENCOUNTER — Other Ambulatory Visit (HOSPITAL_COMMUNITY): Payer: Self-pay

## 2021-12-22 ENCOUNTER — Other Ambulatory Visit (HOSPITAL_COMMUNITY): Payer: Self-pay

## 2021-12-23 ENCOUNTER — Other Ambulatory Visit (HOSPITAL_COMMUNITY): Payer: Self-pay

## 2021-12-28 ENCOUNTER — Other Ambulatory Visit: Payer: Self-pay

## 2021-12-28 ENCOUNTER — Other Ambulatory Visit (HOSPITAL_COMMUNITY): Payer: Self-pay

## 2021-12-28 ENCOUNTER — Emergency Department (HOSPITAL_BASED_OUTPATIENT_CLINIC_OR_DEPARTMENT_OTHER)
Admission: EM | Admit: 2021-12-28 | Discharge: 2021-12-28 | Disposition: A | Discharge status: Home / Self Care | Payer: 59 | Attending: Emergency Medicine | Admitting: Emergency Medicine

## 2021-12-28 ENCOUNTER — Encounter (HOSPITAL_BASED_OUTPATIENT_CLINIC_OR_DEPARTMENT_OTHER): Payer: Self-pay | Admitting: Emergency Medicine

## 2021-12-28 DIAGNOSIS — R112 Nausea with vomiting, unspecified: Secondary | ICD-10-CM | POA: Insufficient documentation

## 2021-12-28 DIAGNOSIS — J45909 Unspecified asthma, uncomplicated: Secondary | ICD-10-CM | POA: Insufficient documentation

## 2021-12-28 DIAGNOSIS — E860 Dehydration: Secondary | ICD-10-CM | POA: Insufficient documentation

## 2021-12-28 DIAGNOSIS — R1013 Epigastric pain: Secondary | ICD-10-CM | POA: Diagnosis present

## 2021-12-28 LAB — URINALYSIS, ROUTINE W REFLEX MICROSCOPIC
Glucose, UA: NEGATIVE mg/dL
Hgb urine dipstick: NEGATIVE
Ketones, ur: NEGATIVE mg/dL
Leukocytes,Ua: NEGATIVE
Nitrite: NEGATIVE
Protein, ur: NEGATIVE mg/dL
Specific Gravity, Urine: 1.02 (ref 1.005–1.030)
pH: 8.5 — ABNORMAL HIGH (ref 5.0–8.0)

## 2021-12-28 LAB — COMPREHENSIVE METABOLIC PANEL
ALT: 32 U/L (ref 0–44)
AST: 24 U/L (ref 15–41)
Albumin: 4.8 g/dL (ref 3.5–5.0)
Alkaline Phosphatase: 40 U/L (ref 38–126)
Anion gap: 8 (ref 5–15)
BUN: 10 mg/dL (ref 6–20)
CO2: 28 mmol/L (ref 22–32)
Calcium: 9.3 mg/dL (ref 8.9–10.3)
Chloride: 104 mmol/L (ref 98–111)
Creatinine, Ser: 1.24 mg/dL (ref 0.61–1.24)
GFR, Estimated: >60 mL/min (ref >60)
Glucose, Bld: 115 mg/dL — ABNORMAL HIGH (ref 70–99)
Potassium: 3.7 mmol/L (ref 3.5–5.1)
Sodium: 140 mmol/L (ref 135–145)
Total Bilirubin: 1.2 mg/dL (ref 0.3–1.2)
Total Protein: 7.6 g/dL (ref 6.5–8.1)

## 2021-12-28 LAB — CBC WITH DIFFERENTIAL/PLATELET
Abs Immature Granulocytes: 0.02 10*3/uL (ref 0.00–0.07)
Basophils Absolute: 0 10*3/uL (ref 0.0–0.1)
Basophils Relative: 1 %
Eosinophils Absolute: 0.2 10*3/uL (ref 0.0–0.5)
Eosinophils Relative: 3 %
HCT: 45.7 % (ref 39.0–52.0)
Hemoglobin: 16.2 g/dL (ref 13.0–17.0)
Immature Granulocytes: 0 %
Lymphocytes Relative: 19 %
Lymphs Abs: 1 10*3/uL (ref 0.7–4.0)
MCH: 30.7 pg (ref 26.0–34.0)
MCHC: 35.4 g/dL (ref 30.0–36.0)
MCV: 86.6 fL (ref 80.0–100.0)
Monocytes Absolute: 0.4 10*3/uL (ref 0.1–1.0)
Monocytes Relative: 7 %
Neutro Abs: 3.7 10*3/uL (ref 1.7–7.7)
Neutrophils Relative %: 70 %
Platelets: 282 10*3/uL (ref 150–400)
RBC: 5.28 MIL/uL (ref 4.22–5.81)
RDW: 12.7 % (ref 11.5–15.5)
WBC: 5.2 10*3/uL (ref 4.0–10.5)
nRBC: 0 % (ref 0.0–0.2)

## 2021-12-28 LAB — LIPASE, BLOOD: Lipase: 35 U/L (ref 11–51)

## 2021-12-28 MED ORDER — SODIUM CHLORIDE 0.9 % IV BOLUS
1000.0000 mL | Freq: Once | INTRAVENOUS | Status: AC
  Administered 2021-12-28: 1000 mL via INTRAVENOUS

## 2021-12-28 MED ORDER — ONDANSETRON HCL 4 MG/2ML IJ SOLN
4.0000 mg | Freq: Once | INTRAMUSCULAR | Status: AC
  Administered 2021-12-28: 4 mg via INTRAVENOUS
  Filled 2021-12-28: qty 2

## 2021-12-28 MED ORDER — PROMETHAZINE HCL 25 MG PO TABS: 25.0000 mg | ORAL_TABLET | Freq: Four times a day (QID) | ORAL | 0 refills | Status: DC | PRN

## 2021-12-28 MED ORDER — SUCRALFATE 1 G PO TABS: 1.0000 g | ORAL_TABLET | Freq: Three times a day (TID) | ORAL | 0 refills | Status: DC

## 2021-12-28 MED ORDER — METOCLOPRAMIDE HCL 5 MG/ML IJ SOLN
10.0000 mg | Freq: Once | INTRAMUSCULAR | Status: AC
Start: 2021-12-28 — End: 2021-12-28
  Administered 2021-12-28: 10 mg via INTRAVENOUS
  Filled 2021-12-28: qty 2

## 2021-12-28 MED ORDER — DIPHENHYDRAMINE HCL 50 MG/ML IJ SOLN
25.0000 mg | Freq: Once | INTRAMUSCULAR | Status: AC
  Administered 2021-12-28: 25 mg via INTRAVENOUS
  Filled 2021-12-28: qty 1

## 2021-12-28 NOTE — ED Notes (Signed)
Patient denies pain and is resting comfortably. Up to restroom to void, tolerated PO fluids w/o complaint ?

## 2021-12-28 NOTE — ED Provider Notes (Signed)
?[facility] EMERGENCY DEPARTMENT ?Provider Note ? ? ?CSN: [id number] ?Arrival date & time: 12/28/21  0753 ? ?  ? ?History ? ?Chief Complaint  ?Patient presents with  ? Abdominal Pain  ? ? ?Cory Vazquez is a 31 y.o. male. ? ?Pt is a 31 yo male with a hx depression, asthma and GERD.  He's had intermittent abd pain with n/v for several months.  He has seen his pcp and has an appt with GI on 3/27.  Pt said he has been having upper abd pain with n/v for the past few days.  He has not eaten since Saturday, 3/11.  He denies pain with eating, but feels so nauseous, he does not want to eat.  Sx are worse in the morning.  He did have a MRI ordered of his brain to r/o increased ICP, but said he was never contacted to schedule this test.  Pt has been compliant with is protonix and zofran. ? ? ?  ? ?Home Medications ?Prior to Admission medications   ?Medication Sig Start Date End Date Taking? Authorizing Provider  ?promethazine (PHENERGAN) 25 MG tablet Take 1 tablet (25 mg total) by mouth every 6 (six) hours as needed for nausea or vomiting. 12/28/21  Yes Jacalyn LefevreHaviland, Jnyah Brazee, MD  ?sucralfate (CARAFATE) 1 g tablet Take 1 tablet (1 g total) by mouth 4 (four) times daily -  with meals and at bedtime. 12/28/21  Yes Jacalyn LefevreHaviland, Maurya Nethery, MD  ?acetaminophen-codeine (TYLENOL #3) 300-30 MG tablet Take 1-2 tablets by mouth every 4 (four) hours as needed for moderate pain. 08/23/21   Kirtland Bouchardlark, Gilbert W, PA-C  ?gabapentin (NEURONTIN) 100 MG capsule Take 100 mg by mouth daily.    [provider]  ?methylphenidate (RITALIN) 10 MG tablet Take 1 tablet by mouth 2  times daily. 11/29/21   Hunsucker, Lesia SagoMatthew R, MD  ?ondansetron (ZOFRAN ODT) 8 MG disintegrating tablet 8mg  ODT q8 hours prn nausea 08/11/21   Ernie AvenaLawsing, James, MD  ?pantoprazole (PROTONIX) 40 MG tablet Take 1 tablet (40 mg total) by mouth daily. 12/02/21   Nafziger, Kandee Keenory, NP  ?sertraline (ZOLOFT) 50 MG tablet Take 50 mg by mouth daily.    [provider]  ?    ? ?Allergies    ?Nsaids   ? ?Review of Systems   ?Review of Systems  ?Gastrointestinal:  Positive for abdominal pain, nausea and vomiting.  ?All other systems reviewed and are negative. ? ?Physical Exam ?Updated Vital Signs ?BP 101/63 (BP Location: Left Arm)   Pulse 68   Temp 98.2 ?F (36.8 ?C) (Oral)   Resp 16   Ht 5\' 8"  (1.727 m)   Wt 86.2 kg   SpO2 99%   BMI 28.89 kg/m?  ?Physical Exam ?Vitals and nursing note reviewed.  ?Constitutional:   ?   Appearance: He is well-developed.  ?HENT:  ?   Head: Normocephalic and atraumatic.  ?   Mouth/Throat:  ?   Mouth: Mucous membranes are dry.  ?Eyes:  ?   Extraocular Movements: Extraocular movements intact.  ?   Pupils: Pupils are equal, round, and reactive to light.  ?Cardiovascular:  ?   Rate and Rhythm: Normal rate and regular rhythm.  ?Pulmonary:  ?   Effort: Pulmonary effort is normal.  ?   Breath sounds: Normal breath sounds.  ?Abdominal:  ?   General: Abdomen is flat. Bowel sounds are normal.  ?   Palpations: Abdomen is soft.  ?   Tenderness: There is abdominal tenderness in the epigastric  area.  ?Skin: ?   Capillary Refill: Capillary refill takes less than 2 seconds.  ?Neurological:  ?   General: No focal deficit present.  ?   Mental Status: He is alert and oriented to person, place, and time.  ?Psychiatric:     ?   Mood and Affect: Mood normal.     ?   Behavior: Behavior normal.  ? ? ?ED Results / Procedures / Treatments   ?Labs ?(all labs ordered are listed, but only abnormal results are displayed) ?Labs Reviewed  ?COMPREHENSIVE METABOLIC PANEL - Abnormal; Notable for the following components:  ?    Result Value  ? Glucose, Bld 115 (*)   ? All other components within normal limits  ?URINALYSIS, ROUTINE W REFLEX MICROSCOPIC - Abnormal; Notable for the following components:  ? pH 8.5 (*)   ? Bilirubin Urine SMALL (*)   ? All other components within normal limits  ?CBC WITH DIFFERENTIAL/PLATELET  ?LIPASE, BLOOD  ? ? ?EKG ?None ? ?Radiology ?No results  found. ? ?Procedures ?Procedures  ? ? ?Medications Ordered in ED ?Medications  ?sodium chloride 0.9 % bolus 1,000 mL (0 mLs Intravenous Stopped 12/28/21 0937)  ?ondansetron [facility]) injection 4 mg (4 mg Intravenous Given 12/28/21 0845)  ?metoCLOPramide (REGLAN) injection 10 mg (10 mg Intravenous Given 12/28/21 1000)  ?diphenhydrAMINE (BENADRYL) injection 25 mg (25 mg Intravenous Given 12/28/21 1003)  ?sodium chloride 0.9 % bolus 1,000 mL (0 mLs Intravenous Stopped 12/28/21 1107)  ? ? ?ED Course/ Medical Decision Making/ A&P ?  ?                        ?Medical Decision Making ?Amount and/or Complexity of Data Reviewed ?Labs: ordered. ? ?Risk ?Prescription drug management. ? ? ?This patient presents to the ED for concern of abd pain and n/v, this involves an extensive number of treatment options, and is a complaint that carries with it a high risk of complications and morbidity.  The differential diagnosis includes gerd, ulcer, infection ? ? ?Co morbidities that complicate the patient evaluation ? ?depression, asthma and GERD ? ? ?Additional history obtained: ? ?Additional history obtained from epic chart review ? ? ? ?Lab Tests: ? ?I Ordered, and personally interpreted labs.  The pertinent results include:  cmp nl, cbc is nl, ua nl, lipase nl ? ? ?Cardiac Monitoring: ? ?The patient was maintained on a cardiac monitor.  I personally viewed and interpreted the cardiac monitored which showed an underlying rhythm of: nsr ? ? ?Medicines ordered and prescription drug management: ? ?I ordered medication including IVFs and zofran  for dehydration/n/v and then reglan/benadryl.  Pt is now feeling much better. ?Reevaluation of the patient after these medicines showed that the patient improved ?I have reviewed the patients home medicines and have made adjustments as needed ? ? ?Test Considered: ? ?CT abd/pelvis.  However, sx are not new and labs are nl.  No fever. ? ? ? ?Problem List / ED Course: ? ?Abd pain + n/v:  pt is now able  to tolerate po fluids.  I will try adding carafate to pt's regimen.  I will also add phenergan as the zofran has not been helping.  Pt knows to keep his appt with GI. ? ? ?Reevaluation: ? ?After the interventions noted above, I reevaluated the patient and found that they have :improved ? ? ?Social Determinants of Health: ? ?Lives at home  ? ? ?Dispostion: ? ?After consideration of the diagnostic results and  the patients response to treatment, I feel that the patent would benefit from discharge with outpatient f/u.   ? ? ? ? ? ? ? ?Final Clinical Impression(s) / ED Diagnoses ?Final diagnoses:  ?Dehydration  ?Epigastric abdominal pain  ?Nausea and vomiting, unspecified vomiting type  ? ? ?Rx / DC Orders ?ED Discharge Orders   ? ?      Ordered  ?  sucralfate (CARAFATE) 1 g tablet  3 times daily with meals & bedtime       ? 12/28/21 1118  ?  promethazine (PHENERGAN) 25 MG tablet  Every 6 hours PRN       ? 12/28/21 1118  ? ?  ?  ? ?  ? ? ?  ?Jacalyn Lefevre, MD ?12/28/21 1123 ? ?

## 2021-12-28 NOTE — ED Notes (Signed)
States has been having abd/ GI issues, taking zofran, but still having nausea and abd pain, has MD appt scheduled soon. Appetite is poor. Becomes extremely nauseated when eating but does not have pain when eating or post eating.  ?

## 2021-12-28 NOTE — ED Notes (Signed)
Urinal provided for urine spec ?

## 2021-12-28 NOTE — ED Triage Notes (Signed)
Abdominal pain for 4-5 days.  Some vomiting.  No known fever.  Pt has appt for GI on March 27.  Pt states pain has gradually worsened. ?

## 2021-12-29 ENCOUNTER — Encounter: Payer: Self-pay | Admitting: Adult Health

## 2021-12-29 ENCOUNTER — Telehealth (INDEPENDENT_AMBULATORY_CARE_PROVIDER_SITE_OTHER): Payer: 59 | Admitting: Adult Health

## 2021-12-29 VITALS — Ht 68.0 in

## 2021-12-29 DIAGNOSIS — R112 Nausea with vomiting, unspecified: Secondary | ICD-10-CM

## 2021-12-29 DIAGNOSIS — K921 Melena: Secondary | ICD-10-CM

## 2021-12-29 MED ORDER — PROMETHAZINE HCL 12.5 MG RE SUPP: 12.5000 mg | Freq: Four times a day (QID) | RECTAL | 1 refills | Status: DC | PRN

## 2021-12-29 NOTE — Telephone Encounter (Signed)
Pt has been scheduled.  °

## 2021-12-29 NOTE — Progress Notes (Signed)
Virtual Visit via Video Note ? ?I connected with Cory Vazquez on 12/29/21 at 11:30 AM EDT by a video enabled telemedicine application and verified that I am speaking with the correct person using two identifiers. ? Location patient: home ?Location provider:work or home office ?Persons participating in the virtual visit: patient, provider ? ?I discussed the limitations of evaluation and management by telemedicine and the availability of in person appointments. The patient expressed understanding and agreed to proceed. ? ? ?HPI: ?31 year old male who  has a past medical history of Allergy, Asthma, and Depression. ? ?Seen in the emergency room yesterday for intermittent abdominal pain with nausea and vomiting which she has had for several months.  He does have a appointment with gastroenterology in 12 days.  When he was seen yesterday in the ER he was having upper abdominal pain with nausea and vomiting for the past few days.  He had not been able to eat anything due to the nausea and vomiting for roughly 3 days.  Symptoms seem to be worse in the morning.  He has been taking his Protonix and Zofran but Zofran was not helping with the vomiting and nausea ? ?In the ER he was given 2 L normal saline via IV dehydration.  Was given Zofran and Reglan as well.  He is able to tolerate p.o. liquids.  He was discharged home with Phenergan and Carafate. ? ?Today he reports that he woke up this morning and continued to have nausea and vomiting.  He did have a bowel movement this morning which appeared as dark red blood "look like tar". ? ?Phenergan is working to help with the nausea but it makes him very sleepy.  Just started his Carafate this morning ? ?Has not had anything to eat or drink yet today due to the nausea and vomiting ? ?Did call over to gastroenterology and was placed on cancellation list ? ? ? ?ROS: See pertinent positives and negatives per HPI. ? ?Past Medical History:  ?Diagnosis Date  ? Allergy   ? Asthma   ?  Depression   ? ? ?Past Surgical History:  ?Procedure Laterality Date  ? SEPTOPLASTY    ? TONSILLECTOMY    ? ? ?Family History  ?Problem Relation Age of Onset  ? Depression Mother   ? Arthritis Maternal Grandmother   ? Hypertension Maternal Grandmother   ? Stroke Maternal Grandmother   ? Hypertension Maternal Grandfather   ? Diabetes Maternal Grandfather   ? Arthritis Maternal Grandfather   ? Cancer Paternal Grandmother   ? Hearing loss Paternal Grandfather   ? ? ? ? ? ?Current Outpatient Medications:  ?  acetaminophen-codeine (TYLENOL #3) 300-30 MG tablet, Take 1-2 tablets by mouth every 4 (four) hours as needed for moderate pain., Disp: 30 tablet, Rfl: 0 ?  gabapentin (NEURONTIN) 100 MG capsule, Take 100 mg by mouth daily., Disp: , Rfl:  ?  methylphenidate (RITALIN) 10 MG tablet, Take 1 tablet by mouth 2  times daily., Disp: 60 tablet, Rfl: 0 ?  ondansetron (ZOFRAN ODT) 8 MG disintegrating tablet, 8mg  ODT q8 hours prn nausea, Disp: 12 tablet, Rfl: 0 ?  pantoprazole (PROTONIX) 40 MG tablet, Take 1 tablet (40 mg total) by mouth daily., Disp: 30 tablet, Rfl: 0 ?  promethazine (PHENERGAN) 12.5 MG suppository, Place 1 suppository (12.5 mg total) rectally every 6 (six) hours as needed for nausea or vomiting., Disp: 12 each, Rfl: 1 ?  promethazine (PHENERGAN) 25 MG tablet, Take 1 tablet (25 mg  total) by mouth every 6 (six) hours as needed for nausea or vomiting., Disp: 30 tablet, Rfl: 0 ?  sertraline (ZOLOFT) 50 MG tablet, Take 50 mg by mouth daily., Disp: , Rfl:  ?  sucralfate (CARAFATE) 1 g tablet, Take 1 tablet (1 g total) by mouth 4 (four) times daily -  with meals and at bedtime., Disp: 90 tablet, Rfl: 0 ? ?EXAM: ? ?VITALS per patient if applicable: ? ?GENERAL: alert, oriented, appears well and in no acute distress ? ?HEENT: atraumatic, conjunttiva clear, no obvious abnormalities on inspection of external nose and ears ? ?NECK: normal movements of the head and neck ? ?LUNGS: on inspection no signs of respiratory  distress, breathing rate appears normal, no obvious gross SOB, gasping or wheezing ? ?CV: no obvious cyanosis ? ?MS: moves all visible extremities without noticeable abnormality ? ?PSYCH/NEURO: pleasant and cooperative, no obvious depression or anxiety, speech and thought processing grossly intact ? ?ASSESSMENT AND PLAN: ? ?Discussed the following assessment and plan: ? ?1. Nausea and vomiting, unspecified vomiting type ?- Will send in lower dose phenergen suppository to see if this helps with the fatigue aspect ?- Encouraged to get some PO on his stomach  ?- promethazine (PHENERGAN) 12.5 MG suppository; Place 1 suppository (12.5 mg total) rectally every 6 (six) hours as needed for nausea or vomiting.  Dispense: 12 each; Refill: 1 ? ?2. Gastrointestinal hemorrhage with melena ?- Likely from PUD - continue with Carafate QID ?- Will try and get him into GI sooner  ? ? ?  ?I discussed the assessment and treatment plan with the patient. The patient was provided an opportunity to ask questions and all were answered. The patient agreed with the plan and demonstrated an understanding of the instructions. ?  ?The patient was advised to call back or seek an in-person evaluation if the symptoms worsen or if the condition fails to improve as anticipated. ? ? ?Shirline Frees, NP  ? ?

## 2021-12-30 ENCOUNTER — Encounter: Payer: Self-pay | Admitting: Adult Health

## 2022-01-10 ENCOUNTER — Encounter: Payer: Self-pay | Admitting: Gastroenterology

## 2022-01-10 ENCOUNTER — Ambulatory Visit (INDEPENDENT_AMBULATORY_CARE_PROVIDER_SITE_OTHER): Payer: 59 | Admitting: Gastroenterology

## 2022-01-10 VITALS — BP 112/74 | HR 80 | Ht 68.0 in | Wt 186.0 lb

## 2022-01-10 DIAGNOSIS — R634 Abnormal weight loss: Secondary | ICD-10-CM | POA: Diagnosis not present

## 2022-01-10 DIAGNOSIS — R112 Nausea with vomiting, unspecified: Secondary | ICD-10-CM

## 2022-01-10 NOTE — Progress Notes (Signed)
? ?Referring Provider: Shirline Frees, NP ?Primary Care Physician:  Shirline Frees, NP ? ? ?Reason for Consultation:  Vomiting ? ? ?IMPRESSION:  ?Near daily vomiting ?10 pounds weight loss in 6 months ?Near daily alternative cannabis use ? ? ?Chronic symptoms. Liver enzymes and lipase are normal. He does not meet criteria for cyclic vomiting syndrome given his near daily symptoms. EGD recommended to rule out other organic disease including esophagitis, gastritis, H pylori, gastric outlet obstruction, and celiac disease. If endoscopy is negative, will plan abdominal imaging.   ? ?If endoscopy and CT are negative, will consider tricyclic antidepressants for prophylaxis, starting with amitriptyline 25mg  QHS, titrated up by 25 mg each week to minimize emergence of side effects. Prior to starting therapy, will obtain an EKG to monitor the QT interval.   ? ?Controlling anxiety and depression with options other than alternative cannabis should be reviewed with PCP. ? ?Abstaining from heavy cannabis use is important to achieve good outcomes.   ? ? ?PLAN: ?EGD with esophageal, gastric, and duodenal biopsies ?Cross-sectional imaging if EGD is non-diagnostic ?MRI of the brain ordered by NP Nafziger ? ? ?HPI: Cory Vazquez is a 31 y.o. male referred by NP Nafziger for further evaluation of nausea and vomiting.  The history is obtained through the patient and review of his electronic health record.  He has a history of allergies, asthma, sleep apnea, and depression. He is a 28 at [employer].  ? ?6 months of intermittent nausea and vomiting, worse in the morning. Symptoms occur most days.  Bowel movement earlier this month that was described as dark red blood and "looked like tar." No other change in bowel habits except for loose stools. He has lost 10 pounds over the last 6 months as he doesn't feel well enough to eat. He often only eats one meal a day, but, when he eats, he is able to eat a  significant amount of calories.  ? ?Symptoms have been severe enough that he's needed to go to the ED. At times, he will miss several days of work.  ? ?He uses a vaped alternative canaibis regularly for many years to manage stress and anxiety, although finds that it is also helping his nausea.  He took a break for one one and found that his symptoms did not improve.  He previously vaped nicotine.  ? ?Hot shower provides relief. No other identified exacerbating or relieving features.  ? ?He denies the use of NSAIDs.  ? ?He has been treated with Protonix, Zofran, Reglan, Phenergan, and Carafate. ?The Phenergan helps with the nausea but makes him sleepy. He had a 5 day respite in symptoms after starting Carafate.  ? ?Labs 12/28/2021 show a normal CMP, CBC, and lipase except for glucose of 115. ?He did have a MRI ordered of his brain to r/o increased ICP, but said he was never contacted to schedule this test.  ? ?No prior abdominal imaging. No prior endoscopic evaluation.  ? ? ? ?Past Medical History:  ?Diagnosis Date  ? Allergy   ? Asthma   ? Depression   ? ? ?Past Surgical History:  ?Procedure Laterality Date  ? SEPTOPLASTY    ? TONSILLECTOMY    ? ? ? ?Current Outpatient Medications  ?Medication Sig Dispense Refill  ? clonazePAM (KLONOPIN) 0.5 MG tablet Take 0.5 mg by mouth daily as needed.    ? gabapentin (NEURONTIN) 100 MG capsule Take 100 mg by mouth daily.    ? methylphenidate (RITALIN)  10 MG tablet Take 1 tablet by mouth 2  times daily. 60 tablet 0  ? mirtazapine (REMERON) 15 MG tablet Take 15 mg by mouth at bedtime.    ? ondansetron (ZOFRAN ODT) 8 MG disintegrating tablet 8mg  ODT q8 hours prn nausea 12 tablet 0  ? promethazine (PHENERGAN) 25 MG tablet Take 1 tablet (25 mg total) by mouth every 6 (six) hours as needed for nausea or vomiting. 30 tablet 0  ? sertraline (ZOLOFT) 100 MG tablet Take 100 mg by mouth daily.    ? sucralfate (CARAFATE) 1 g tablet Take 1 tablet (1 g total) by mouth 4 (four) times daily -   with meals and at bedtime. 90 tablet 0  ? ?No current facility-administered medications for this visit.  ? ? ?Allergies as of 01/10/2022 - Review Complete 01/10/2022  ?Allergen Reaction Noted  ? Nsaids Anaphylaxis 09/30/2011  ? ? ?Family History  ?Problem Relation Age of Onset  ? Depression Mother   ? Arthritis Maternal Grandmother   ? Hypertension Maternal Grandmother   ? Stroke Maternal Grandmother   ? Hypertension Maternal Grandfather   ? Diabetes Maternal Grandfather   ? Arthritis Maternal Grandfather   ? Cancer Paternal Grandmother   ? Hearing loss Paternal Grandfather   ? ? ?Social History  ? ?Socioeconomic History  ? Marital status: Married  ?  Spouse name: Not on file  ? Number of children: 1  ? Years of education: 67  ? Highest education level: High school graduate  ?Occupational History  ? Not on file  ?Tobacco Use  ? Smoking status: Former  ?  Packs/day: 0.50  ?  Types: E-cigarettes, Cigarettes  ?  Quit date: 06/08/2015  ?  Years since quitting: 6.5  ? Smokeless tobacco: Never  ?Vaping Use  ? Vaping Use: Every day  ? Substances: CBD  ?Substance and Sexual Activity  ? Alcohol use: Not Currently  ?  Alcohol/week: 6.0 standard drinks  ?  Types: 6 Shots of liquor per week  ? Drug use: No  ? Sexual activity: Yes  ?Other Topics Concern  ? Not on file  ?Social History Narrative  ? Not on file  ? ?Social Determinants of Health  ? ?Financial Resource Strain: Not on file  ?Food Insecurity: Not on file  ?Transportation Needs: Not on file  ?Physical Activity: Not on file  ?Stress: Not on file  ?Social Connections: Not on file  ?Intimate Partner Violence: Not on file  ? ? ?Review of Systems: ?12 system ROS is negative except as noted above with the addition of headaches.  ? ?Physical Exam: ?General:   Alert,  well-nourished, pleasant and cooperative in NAD ?Head:  Normocephalic and atraumatic. ?Eyes:  Sclera clear, no icterus.   Conjunctiva pink. ?Ears:  Normal auditory acuity. ?Nose:  No deformity, discharge,  or  lesions. ?Mouth:  No deformity or lesions.   ?Neck:  Supple; no masses or thyromegaly. ?Lungs:  Clear throughout to auscultation.   No wheezes. ?Heart:  Regular rate and rhythm; no murmurs. ?Abdomen:  Soft, nontender, nondistended, normal bowel sounds, no rebound or guarding. No hepatosplenomegaly.   ?Rectal:  Deferred  ?Msk:  Symmetrical. No boney deformities ?LAD: No inguinal or umbilical LAD ?Extremities:  No clubbing or edema. ?Neurologic:  Alert and  oriented x4;  grossly nonfocal ?Skin:  Intact without significant lesions or rashes. ?Psych:  Alert and cooperative. Normal mood and affect. ? ? ? ?Alyssamae Klinck L. 06/10/2015, MD, MPH ?01/10/2022, 8:16 PM ? ? ? ?  ?

## 2022-01-10 NOTE — Patient Instructions (Signed)
It was my pleasure to provide care to you today. Based on our discussion, I am providing you with my recommendations below:  RECOMMENDATION(S):   ENDOSCOPY:   You have been scheduled for an endoscopy. Please follow written instructions given to you at your visit today.  INHALERS:   If you use inhalers (even only as needed), please bring them with you on the day of your procedure.  FOLLOW UP:  After your procedure, you will receive a call from my office staff regarding my recommendation for follow up.  BMI:  If you are age 64 or younger, your body mass index should be between 19-25. Your There is no height or weight on file to calculate BMI. If this is out of the aformentioned range listed, please consider follow up with your Primary Care Provider.   MY CHART:  The [facility] providers would like to encourage you to use MYCHART to communicate with providers for non-urgent requests or questions.  Due to long hold times on the telephone, sending your provider a message by MYCHART may be a faster and more efficient way to get a response.  Please allow 48 business hours for a response.  Please remember that this is for non-urgent requests.   Thank you for trusting me with your gastrointestinal care!    Kimberly Beavers, MD, MPH  

## 2022-01-17 ENCOUNTER — Ambulatory Visit (AMBULATORY_SURGERY_CENTER): Payer: 59 | Admitting: Gastroenterology

## 2022-01-17 ENCOUNTER — Encounter: Payer: Self-pay | Admitting: Gastroenterology

## 2022-01-17 ENCOUNTER — Telehealth: Payer: Self-pay | Admitting: Pulmonary Disease

## 2022-01-17 VITALS — BP 109/66 | HR 70 | Temp 98.4°F | Resp 16 | Ht 68.0 in | Wt 186.0 lb

## 2022-01-17 DIAGNOSIS — K219 Gastro-esophageal reflux disease without esophagitis: Secondary | ICD-10-CM | POA: Diagnosis not present

## 2022-01-17 DIAGNOSIS — K2960 Other gastritis without bleeding: Secondary | ICD-10-CM | POA: Diagnosis not present

## 2022-01-17 DIAGNOSIS — R112 Nausea with vomiting, unspecified: Secondary | ICD-10-CM

## 2022-01-17 DIAGNOSIS — K2100 Gastro-esophageal reflux disease with esophagitis, without bleeding: Secondary | ICD-10-CM

## 2022-01-17 DIAGNOSIS — K2950 Unspecified chronic gastritis without bleeding: Secondary | ICD-10-CM

## 2022-01-17 MED ORDER — SODIUM CHLORIDE 0.9 % IV SOLN: 500.0000 mL | Freq: Once | INTRAVENOUS | Status: DC

## 2022-01-17 NOTE — Progress Notes (Signed)
Report to PACU, RN, vss, BBS= Clear.  

## 2022-01-17 NOTE — Progress Notes (Signed)
Called to room to assist during endoscopic procedure.  Patient ID and intended procedure confirmed with present staff. Received instructions for my participation in the procedure from the performing physician.  

## 2022-01-17 NOTE — Telephone Encounter (Signed)
Routing to AO as an FYI> ?

## 2022-01-17 NOTE — Patient Instructions (Addendum)
Await for pathology results. ? ?No aspirin, ibuprofen, naproxen, or other non-steroidal anti-inflammatory drugs. ? ?Proceed with MRI as ordered by primary care provider. ? ?YOU HAD AN ENDOSCOPIC PROCEDURE TODAY AT THE [facility]:   Refer to the procedure report that was given to you for any specific questions about what was found during the examination.  If the procedure report does not answer your questions, please call your gastroenterologist to clarify.  If you requested that your care partner not be given the details of your procedure findings, then the procedure report has been included in a sealed envelope for you to review at your convenience later. ? ?YOU SHOULD EXPECT: Some feelings of bloating in the abdomen. Passage of more gas than usual.  Walking can help get rid of the air that was put into your GI tract during the procedure and reduce the bloating. If you had a lower endoscopy (such as a colonoscopy or flexible sigmoidoscopy) you may notice spotting of blood in your stool or on the toilet paper. If you underwent a bowel prep for your procedure, you may not have a normal bowel movement for a few days. ? ?Please Note:  You might notice some irritation and congestion in your nose or some drainage.  This is from the oxygen used during your procedure.  There is no need for concern and it should clear up in a day or so. ? ?SYMPTOMS TO REPORT IMMEDIATELY: ? ?Following upper endoscopy (EGD) ? Vomiting of blood or coffee ground material ? New chest pain or pain under the shoulder blades ? Painful or persistently difficult swallowing ? New shortness of breath ? Fever of 100?F or higher ? Black, tarry-looking stools ? ?For urgent or emergent issues, a gastroenterologist can be reached at any hour by calling [phone]. ?Do not use MyChart messaging for urgent concerns.  ? ? ?DIET:  We do recommend a small meal at first, but then you may proceed to your regular diet.  Drink plenty of fluids but  you should avoid alcoholic beverages for 24 hours. ? ?ACTIVITY:  You should plan to take it easy for the rest of today and you should NOT DRIVE or use heavy machinery until tomorrow (because of the sedation medicines used during the test).   ? ?FOLLOW UP: ?Our staff will call the number listed on your records 48-72 hours following your procedure to check on you and address any questions or concerns that you may have regarding the information given to you following your procedure. If we do not reach you, we will leave a message.  We will attempt to reach you two times.  During this call, we will ask if you have developed any symptoms of COVID 19. If you develop any symptoms (ie: fever, flu-like symptoms, shortness of breath, cough etc.) before then, please call [phone].  If you test positive for Covid 19 in the 2 weeks post procedure, please call and report this information to [place].   ? ?If any biopsies were taken you will be contacted by phone or by letter within the next 1-3 weeks.  Please call us at [phone] if you have not heard about the biopsies in 3 weeks.  ? ? ?SIGNATURES/CONFIDENTIALITY: ?You and/or your care partner have signed paperwork which will be entered into your electronic medical record.  These signatures attest to the fact that that the information above on your After Visit Summary has been reviewed and is understood.  Full responsibility of the confidentiality  of this discharge information lies with you and/or your care-partner. ? ?

## 2022-01-17 NOTE — Telephone Encounter (Addendum)
Mom of patient called to inform Dr that her son is currently using drugs again and the last Rx that was giving to him in office on 03/24/21 he's using them as well. He's been using all type of pills and might be calling to get an refill. Mom states she does not want him to get anything because he has been abusing them.  Mom was informed that she isn't on DPR, and no info could be disclosed to her.   She stated that she just wanted to let someone know because she wants to save her son, and possibly get him into rehab soon she can be reached at [phone], or Father [phone] ?

## 2022-01-17 NOTE — Op Note (Signed)
[facility] ?Patient Name: Cory Vazquez ?Procedure Date: 01/17/2022 10:05 AM ?MRN: [id number] ?Endoscopist: Tressia Danas MD, MD ?Age: 31 ?Referring MD:  ?Date of Birth: [birth year] ?Gender: Male ?Account #: [id number] ?Procedure:                Upper GI endoscopy ?Indications:              Vomiting, Weight loss ?Medicines:                Monitored Anesthesia Care ?Procedure:                Pre-Anesthesia Assessment: ?                          - Prior to the procedure, a History and Physical  ?                          was performed, and patient medications and  ?                          allergies were reviewed. The patient's tolerance of  ?                          previous anesthesia was also reviewed. The risks  ?                          and benefits of the procedure and the sedation  ?                          options and risks were discussed with the patient.  ?                          All questions were answered, and informed consent  ?                          was obtained. Prior Anticoagulants: The patient has  ?                          taken no previous anticoagulant or antiplatelet  ?                          agents. ASA Grade Assessment: II - A patient with  ?                          mild systemic disease. After reviewing the risks  ?                          and benefits, the patient was deemed in  ?                          satisfactory condition to undergo the procedure. ?                          After obtaining informed consent, the endoscope was  ?  passed under direct vision. Throughout the  ?                          procedure, the patient's blood pressure, pulse, and  ?                          oxygen saturations were monitored continuously. The  ?                          Endoscope was introduced through the mouth, and  ?                          advanced to the third part of duodenum. The upper  ?                          GI endoscopy was accomplished  without difficulty.  ?                          The patient tolerated the procedure well. ?Scope In: ?Scope Out: ?Findings:                 The examined esophagus was normal. Biopsies were  ?                          obtained from the proximal and distal esophagus  ?                          with cold forceps for histology of suspected  ?                          eosinophilic esophagitis. ?                          Localized mild inflammation characterized by  ?                          erythema, friability and granularity was found in  ?                          the gastric antrum. Biopsies were taken with a cold  ?                          forceps for histology. Estimated blood loss was  ?                          minimal. ?                          The examined duodenum was normal. Biopsies were  ?                          taken with a cold forceps for histology. Estimated  ?                          blood loss was minimal. ?  The cardia and gastric fundus were normal on  ?                          retroflexion. ?                          The exam was otherwise without abnormality. ?Complications:            No immediate complications. ?Estimated Blood Loss:     Estimated blood loss was minimal. ?Impression:               - Normal esophagus. Biopsied. ?                          - Gastritis. Biopsied. ?                          - Normal examined duodenum. Biopsied. ?                          - The examination was otherwise normal. ?Recommendation:           - Patient has a contact number available for  ?                          emergencies. The signs and symptoms of potential  ?                          delayed complications were discussed with the  ?                          patient. Return to normal activities tomorrow.  ?                          Written discharge instructions were provided to the  ?                          patient. ?                          - Resume previous diet. ?                           - Continue present medications. ?                          - No aspirin, ibuprofen, naproxen, or other  ?                          non-steroidal anti-inflammatory drugs. ?                          - Await pathology results. ?                          - Proceed with MRI as ordered by primary care  ?  provider. ?                          - Marijuana use, alcohol, and pain medications may  ?                          be causing or exacerbating your vomiting. I  ?                          recommend that you stop using these for at least 2  ?                          weeks to see if we can make an improvement in your  ?                          symptoms. ?Tressia Danas MD, MD ?01/17/2022 10:42:16 AM ?This report has been signed electronically. ?

## 2022-01-17 NOTE — Progress Notes (Signed)
Indications for Upper Endoscopy:  ? ?Near daily vomiting ?10 pounds weight loss in 6 months ? ?No change in history or physical exam since office visit 01/10/22. See that note for complete details. The patient remains an appropriate candidate for monitored anesthesia care in the endoscopy center this morning.  ?

## 2022-01-18 ENCOUNTER — Other Ambulatory Visit: Payer: Self-pay

## 2022-01-18 ENCOUNTER — Emergency Department (HOSPITAL_BASED_OUTPATIENT_CLINIC_OR_DEPARTMENT_OTHER)
Admission: EM | Admit: 2022-01-18 | Discharge: 2022-01-18 | Disposition: A | Discharge status: Home / Self Care | Payer: 59 | Attending: Emergency Medicine | Admitting: Emergency Medicine

## 2022-01-18 ENCOUNTER — Telehealth: Payer: Self-pay

## 2022-01-18 ENCOUNTER — Encounter (HOSPITAL_BASED_OUTPATIENT_CLINIC_OR_DEPARTMENT_OTHER): Payer: Self-pay | Admitting: Emergency Medicine

## 2022-01-18 DIAGNOSIS — R000 Tachycardia, unspecified: Secondary | ICD-10-CM | POA: Insufficient documentation

## 2022-01-18 DIAGNOSIS — F332 Major depressive disorder, recurrent severe without psychotic features: Secondary | ICD-10-CM | POA: Diagnosis present

## 2022-01-18 DIAGNOSIS — F1990 Other psychoactive substance use, unspecified, uncomplicated: Secondary | ICD-10-CM | POA: Insufficient documentation

## 2022-01-18 DIAGNOSIS — Z87891 Personal history of nicotine dependence: Secondary | ICD-10-CM | POA: Diagnosis not present

## 2022-01-18 DIAGNOSIS — J45909 Unspecified asthma, uncomplicated: Secondary | ICD-10-CM | POA: Insufficient documentation

## 2022-01-18 DIAGNOSIS — R45851 Suicidal ideations: Secondary | ICD-10-CM | POA: Diagnosis not present

## 2022-01-18 LAB — CBC WITH DIFFERENTIAL/PLATELET
Abs Immature Granulocytes: 0.02 10*3/uL (ref 0.00–0.07)
Basophils Absolute: 0 10*3/uL (ref 0.0–0.1)
Basophils Relative: 1 %
Eosinophils Absolute: 0 10*3/uL (ref 0.0–0.5)
Eosinophils Relative: 0 %
HCT: 44.7 % (ref 39.0–52.0)
Hemoglobin: 15.6 g/dL (ref 13.0–17.0)
Immature Granulocytes: 0 %
Lymphocytes Relative: 12 %
Lymphs Abs: 0.9 10*3/uL (ref 0.7–4.0)
MCH: 30.2 pg (ref 26.0–34.0)
MCHC: 34.9 g/dL (ref 30.0–36.0)
MCV: 86.5 fL (ref 80.0–100.0)
Monocytes Absolute: 0.5 10*3/uL (ref 0.1–1.0)
Monocytes Relative: 6 %
Neutro Abs: 6.3 10*3/uL (ref 1.7–7.7)
Neutrophils Relative %: 81 %
Platelets: 316 10*3/uL (ref 150–400)
RBC: 5.17 MIL/uL (ref 4.22–5.81)
RDW: 12.8 % (ref 11.5–15.5)
WBC: 7.8 10*3/uL (ref 4.0–10.5)
nRBC: 0 % (ref 0.0–0.2)

## 2022-01-18 LAB — RAPID URINE DRUG SCREEN, HOSP PERFORMED
Amphetamines: NOT DETECTED
Barbiturates: NOT DETECTED
Benzodiazepines: NOT DETECTED
Cocaine: NOT DETECTED
Opiates: NOT DETECTED
Tetrahydrocannabinol: POSITIVE — AB

## 2022-01-18 LAB — COMPREHENSIVE METABOLIC PANEL
ALT: 21 U/L (ref 0–44)
AST: 25 U/L (ref 15–41)
Albumin: 4.8 g/dL (ref 3.5–5.0)
Alkaline Phosphatase: 43 U/L (ref 38–126)
Anion gap: 10 (ref 5–15)
BUN: 7 mg/dL (ref 6–20)
CO2: 26 mmol/L (ref 22–32)
Calcium: 9.2 mg/dL (ref 8.9–10.3)
Chloride: 107 mmol/L (ref 98–111)
Creatinine, Ser: 1.15 mg/dL (ref 0.61–1.24)
GFR, Estimated: >60 mL/min (ref >60)
Glucose, Bld: 122 mg/dL — ABNORMAL HIGH (ref 70–99)
Potassium: 3.7 mmol/L (ref 3.5–5.1)
Sodium: 143 mmol/L (ref 135–145)
Total Bilirubin: 0.7 mg/dL (ref 0.3–1.2)
Total Protein: 7.6 g/dL (ref 6.5–8.1)

## 2022-01-18 LAB — ETHANOL: Alcohol, Ethyl (B): <10 mg/dL (ref <10)

## 2022-01-18 LAB — ACETAMINOPHEN LEVEL: Acetaminophen (Tylenol), Serum: <10 ug/mL — ABNORMAL LOW (ref 10–30)

## 2022-01-18 MED ORDER — NICOTINE 14 MG/24HR TD PT24
14.0000 mg | MEDICATED_PATCH | Freq: Every day | TRANSDERMAL | Status: DC
  Administered 2022-01-18: 14 mg via TRANSDERMAL
  Filled 2022-01-18: qty 1

## 2022-01-18 MED ORDER — LORAZEPAM 1 MG PO TABS
2.0000 mg | ORAL_TABLET | Freq: Once | ORAL | Status: AC
  Administered 2022-01-18: 2 mg via ORAL
  Filled 2022-01-18: qty 2

## 2022-01-18 NOTE — ED Notes (Signed)
Pt done with TTS and became upset and crying, dr Deretha Emory at bedside , pt sobbing ?

## 2022-01-18 NOTE — ED Notes (Addendum)
Sitter entered room after TTS call, patient began having a "panic attack." Pts vitals were obtained and meds were given by RN. Sitter at bedside. ?

## 2022-01-18 NOTE — Discharge Instructions (Signed)
Please follow-up with the resources provided by the psychiatry team.  If you come back to ER with any worsening thoughts of suicide, self-harm, hallucinations, or other new concerning symptom, come back to ER for reassessment. ?

## 2022-01-18 NOTE — BH Assessment (Signed)
TTS completed. Discussed clinical information with the [facility] provider Hillery Jacks, NP) and patient is psych cleared. Patient may discharge home. Clinician will note all recommendations in his AVS.  The recommendations include referreal information to [facility] and [facility] for group therapy. Also, to a new psychiatrist per patient patient's request ([facility]).  ?

## 2022-01-18 NOTE — ED Notes (Signed)
Pt offered food and drink but denied both at this time.  ?

## 2022-01-18 NOTE — ED Notes (Addendum)
Patient on call with TTS at this time.  ?

## 2022-01-18 NOTE — BH Assessment (Addendum)
Comprehensive Clinical Assessment (CCA) Note ? ?01/18/2022 ?Cory Vazquez ?[id number] ? ?Disposition: TTS completed. Discussed clinical information with the [facility] provider Hillery Jacks, NP) and patient is psych cleared. Patient may discharge home. Clinician will note all recommendations in his AVS.  The recommendations include referreal information to [facility] and [facility] for group therapy. Also, to a new psychiatrist per patient patient's request ([facility]). Vanetta Mulders, MD and Aram Beecham, RN provided disposition updates.  ? ?Flowsheet Row ED from 01/18/2022 in [facility] [place] [facility] from 12/28/2021 in [facility] [facility] from 08/11/2021 in [facility] [facility]  ?C-SSRS RISK CATEGORY Low Risk No Risk No Risk  ? ?  ? ?The patient demonstrates the following risk factors for suicide: Chronic risk factors for suicide include: Major Depressive Disorder, Recurrent, Severe, w/o psychotic features and Other Substances Use Disorder (Benzodiazepines). Acute risk factors for suicide include: family or marital conflict. Protective factors for this patient include: responsibility to others (children, family). Considering these factors, the overall suicide risk at this point appears to be low. Patient is appropriate for outpatient follow up.  ? ?Chief Complaint:  ?Chief Complaint  ?Patient presents with  ? Psychiatric Evaluation  ? ?Visit Diagnosis: Major Depressive Disorder, Recurrent, Severe, w/o psychotic features and Other Substances Use Disorder (Benzodiazepines), Mild ? ? ?Patient states that he and his spouse are not getting along. They are having verbal altercations regularly. Patient states, "My wife really knows how to make me feel like a "piece of shit" and that life isn't worth living". He says that his wife makes comments all the time that make him feel as if he is a failure and a bad dad.  Patient is sobbing as the assessment starts.  ? ?Patient with current suicidal ideations. He started to feel suicidal yesterday. However, denies that he has a plan and/or intent. Denies a history of suicide attempt and/or gestures. Protective factors are his 2 children (61 and 83 years old). Denies hx of self injurious behaviors.  ? ?Patient started to experience depressive symptoms 2 yrs, triggered by arguments with spouse. Current depressive symptoms: worthlessness, tearful, fatigue, isolating self from others, guilt, angry/irritable, and lack of motivation to complete task. Denies any issues regarding his sleep routine. He sleeps 5-6 hours per night. Appetite is poor due to "stomach issues". He typically eats 1 meal per day. Denies significant weight loss and/or gain.  ? ?Patient denies current homicidal ideations. Denies history of aggressive and/or assaultive behaviors. However, when he argues with his spouse he reports "fits of rage". States that in the past he has punched the wall and recently did this over weekend, @ his residence. He also mentions that he goes to the gym and will punch the punching bag "over and over again until my rage is gone". On other occasions he has grabbed an axe, and hit a tree "over and over again until his rage has dissolves. He mentions that he would never harm his children and/or spouse only objects. Denies legal issues. No court dates pending. Patient denies that he is on probation and/or parole. Denies AVH's and/or any feeling of paranoia.  ? ?He uses Delta 8 and Klonopin which is prescribed. He started off by smoking marijuana when he was 31 yrs old. Now he uses Delta 8 which he purchases from "smoke shops". Patient reports daily use of Delta 8 for years. His last use was yesterday and he smokes the Delta 8 in  a vape throughout the day.  ? ?Patient prescribed Klonopin by his psychiatrist, Oswaldo Done A. Izediuno at [facility]. Patient's last visit with the psychiatrist was 2  weeks ago. States that he started taking Klonopin 5-7 months ago. He was doing very well and asked to be taken off. Recently re-started the Klonopin 2 weeks ago. States that he is prescribed the medication as a PRN medication. However, he is over taking the Klonopin when he has issues with his spouse. He last took the Klonopin 01/17/2022 and took (3) .5mg 's and today he took (1) .5mg 's. Patient does not have a therapist. Also, denies a history of inpatient psychiatric treatment. Patient denies that he has withdrawal symptoms. Also, no hx of seizures.  ? ?Patient is married, 7 yrs. He has 2 children.  Patient's parents are his biggest support system. Raised by both parents. He is the oldest of 5 siblings. Patient is employed as a for . 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  He enjoys fishing as a hobby. Denies a history of trauma.  ? ?Patient asked how does he feel this Clinician and the [facility] provider would best assist him today. He replies, "My parents want me inpatient for at least a week so that I can separate peacefully with my spouse", "My parents think I have an issues with drugs and I need to detox", "My wife calls me a drug addict so I need to stay in the hospital". ? ?CCA Screening, Triage and Referral (STR) ? ?Patient Reported Information ?How did you hear about International aid/development worker? No data recorded ?What Is the Reason for Your Visit/Call Today? Patient states that he and his spouse are not getting along. They are having verbal altercations regularly. Patient states, "My wife really knows how to make me feel like a "piece of shit" and that life isn't worth living". He says that his wife makes comments all the time that make him feel as if he is a failure and a bad dad. Patient is sobbing as the assessment starts.     Patient with current suicidal ideations. He started to feel suicidal yesterday. However, denies that he has a  plan and/or intent. Denies a history of suicide attempt and/or gestures. Protective factors are his 2 children (38 and 66 years old). Denies hx of self injurious behaviors.     Patient started to experience depressive symptoms 2 yrs, triggered by arguments with spouse. Current depressive symptoms: worthlessness, tearful, fatigue, isolating self from others, guilt, angry/irritable, and lack of motivation to complete task. Denies any issues regarding his sleep routine. He sleeps 5-6 hours per night. Appetite is poor due to "stomach issues". He typically eats 1 meal per day. Denies significant weight loss and/or gain.     Patient denies current homicidal ideations. Denies history of aggressive and/or assaultive behaviors. However, when he argues with his spouse he reports "fits of rage". States that in the past he has punched the wall and recently did this over weekend, @ his residence. He also mentions that he goes to the gym and will punch the punching bag "over and over again until my rage is gone". On other occasions he has grabbed an axe, and hit a tree "over and over again until his rage has dissolves. He mentions that he would never harm his children and/or spouse only objects. Denies legal issues. No court dates pending. Patient denies that he is on probation and/or  parole. Denies AVH's and/or any feeling of paranoia.     He uses Delta 8 and Klonopin which is prescribed. He started off by smoking marijuana when he was 31 yrs old. Now he uses Delta 8 which he purchases from "smoke shops". Patient reports daily use of Delta 8 for years. His last use was yesterday and he smokes the Delta 8 in a vape throughout the day. ? ?How Long Has This Been Causing You Problems? > than 6 months ? ?What Do You Feel Would Help You the Most Today? Treatment for Depression or other mood problem; Medication(s) ? ? ?Have You Recently Had Any Thoughts About Hurting Yourself? Yes ? ?Are You Planning to Commit Suicide/Harm Yourself At  This time? No ? ? ?Have you Recently Had Thoughts About Hurting Someone Karolee Ohslse? No ? ?Are You Planning to Harm Someone at This Time? No ? ?Explanation: No data recorded ? ?Have You Used Any Alcohol or Drugs in t

## 2022-01-18 NOTE — ED Notes (Signed)
Pt still on with TTS ?

## 2022-01-18 NOTE — ED Triage Notes (Signed)
Pt here from home with c/o depression and rage along with some SI /HI , having problems at home with his marriage  ?

## 2022-01-18 NOTE — Telephone Encounter (Signed)
-----   Message from [place], MD sent at 01/17/2022 10:34 AM EDT ----- ?Needs APP office follow-up in 2-4 weeks. Thanks. ? ?KLB ? ?

## 2022-01-18 NOTE — ED Provider Notes (Signed)
31 year old gentleman presenting to ER with concern for anxiety, suicidal ideation.  Medically cleared by Dr. Deretha Emory.  TTS eval pending.  TTS evaluation has been completed, they are recommending discharge, feel patient is stable for outpatient follow-up.  Will update patient and discharged home. ?  ?Milagros Loll, MD ?01/18/22 1633 ? ?

## 2022-01-18 NOTE — ED Provider Notes (Addendum)
?[facility] ?Provider Note ? ? ?CSN: [id number] ?Arrival date & time: 01/18/22  6468 ? ?  ? ?History ? ?No chief complaint on file. ? ? ?Cory Vazquez is a 31 y.o. male. ? ?Patient presenting with suicidal feelings.  Feels depressed.  In triage he mentioned homicidal ideation as well.  But denied that with me.  Also having marital problems.  Patient did not take anything or act on the suicidal feelings but he is concerned he will. ? ?Past medical history significant for history of depression asthma and allergies.  Patient states he is a former smoker.  Quit in 2016 but doing e-cigarettes now.  Drinks 6 shots of liquor per week.  Patient followed by [facility] gastroenterology for vomiting problems and a GI bleed.  Denies any active bleeding currently.  Recent visit by [facility] gastroenterology shows that they are evaluating him for persistent vomiting.  Felt he did not fit into cyclical vomiting type syndrome.  Patient's home medications are significant for Klonopin gabapentin Ritalin Remeron Zofran ODT and Zoloft Carafate and Phenergan. ? ? ?  ? ?Home Medications ?Prior to Admission medications   ?Medication Sig Start Date End Date Taking? Authorizing Provider  ?clonazePAM (KLONOPIN) 0.5 MG tablet Take 0.5 mg by mouth daily as needed. 01/05/22   [provider]  ?gabapentin (NEURONTIN) 100 MG capsule Take 100 mg by mouth daily.    [provider]  ?methylphenidate (RITALIN) 10 MG tablet Take 1 tablet by mouth 2  times daily. 11/29/21   Hunsucker, Lesia Sago, MD  ?mirtazapine (REMERON) 15 MG tablet Take 15 mg by mouth at bedtime. 01/05/22   [provider]  ?ondansetron (ZOFRAN ODT) 8 MG disintegrating tablet 8mg  ODT q8 hours prn nausea 08/11/21   08/13/21, MD  ?promethazine (PHENERGAN) 25 MG tablet Take 1 tablet (25 mg total) by mouth every 6 (six) hours as needed for nausea or vomiting. 12/28/21   12/30/21, MD  ?sertraline (ZOLOFT) 100 MG tablet Take  100 mg by mouth daily. 01/08/22   [provider]  ?sucralfate (CARAFATE) 1 g tablet Take 1 tablet (1 g total) by mouth 4 (four) times daily -  with meals and at bedtime. 12/28/21   12/30/21, MD  ?   ? ?Allergies    ?Nsaids   ? ?Review of Systems   ?Review of Systems  ?Constitutional:  Negative for chills and fever.  ?HENT:  Negative for ear pain and sore throat.   ?Eyes:  Negative for pain and visual disturbance.  ?Respiratory:  Negative for cough and shortness of breath.   ?Cardiovascular:  Negative for chest pain and palpitations.  ?Gastrointestinal:  Negative for abdominal pain and vomiting.  ?Genitourinary:  Negative for dysuria and hematuria.  ?Musculoskeletal:  Negative for arthralgias and back pain.  ?Skin:  Negative for color change and rash.  ?Neurological:  Negative for seizures and syncope.  ?Psychiatric/Behavioral:  Positive for suicidal ideas.   ?All other systems reviewed and are negative. ? ?Physical Exam ?Updated Vital Signs ?BP (!) 149/109   Pulse (!) 112   Temp 98.4 ?F (36.9 ?C)   Resp 18   SpO2 98%  ?Physical Exam ?Vitals and nursing note reviewed.  ?Constitutional:   ?   General: He is not in acute distress. ?   Appearance: Normal appearance. He is well-developed. He is not ill-appearing.  ?HENT:  ?   Head: Normocephalic and atraumatic.  ?Eyes:  ?   Extraocular Movements: Extraocular movements intact.  ?  Conjunctiva/sclera: Conjunctivae normal.  ?   Pupils: Pupils are equal, round, and reactive to light.  ?Cardiovascular:  ?   Rate and Rhythm: Normal rate and regular rhythm.  ?   Heart sounds: No murmur heard. ?Pulmonary:  ?   Effort: Pulmonary effort is normal. No respiratory distress.  ?   Breath sounds: Normal breath sounds. No wheezing, rhonchi or rales.  ?Abdominal:  ?   Palpations: Abdomen is soft.  ?   Tenderness: There is no abdominal tenderness.  ?Musculoskeletal:     ?   General: No swelling.  ?   Cervical back: Neck supple.  ?Skin: ?   General: Skin is warm and  dry.  ?   Capillary Refill: Capillary refill takes less than 2 seconds.  ?Neurological:  ?   General: No focal deficit present.  ?   Mental Status: He is alert and oriented to person, place, and time.  ?Psychiatric:     ?   Mood and Affect: Mood normal.  ? ? ?ED Results / Procedures / Treatments   ?Labs ?(all labs ordered are listed, but only abnormal results are displayed) ?Labs Reviewed  ?RAPID URINE DRUG SCREEN, HOSP PERFORMED  ?ETHANOL  ?COMPREHENSIVE METABOLIC PANEL  ?CBC WITH DIFFERENTIAL/PLATELET  ?ACETAMINOPHEN LEVEL  ? ? ?EKG ?None ? ?Radiology ?No results found. ? ?Procedures ?Procedures  ? ? ?Medications Ordered in ED ?Medications - No data to display ? ?ED Course/ Medical Decision Making/ A&P ?  ?                        ?Medical Decision Making ?Amount and/or Complexity of Data Reviewed ?Labs: ordered. ? ?Risk ?OTC drugs. ? ? ?Patient will be cleared medically.  Labs ordered.  Patient voicing suicidal ideation.  Will need evaluation by behavioral health. ? ?Based on the medications patient is currently taking we will go ahead and get an EKG just to make sure no prolonged QT's. ? ?EKG is just significant for sinus tachycardia with a rate of 107.  QT corrected is 431.  So no prolonged QT. ? ?It is complete metabolic panel liver function test are normal.  Alcohol less than 10.  CBC without any acute abnormalities.  Particular hemoglobin is normal. ? ?Urine drug screen positive for THC.  This may be also responsible for this vomiting problem that has been having.  But not an issue today. ? ?Tylenol level pending. ? ?Patient medically cleared other than the Tylenol level for behavioral health to evaluate him for the suicidal ideation ? ?Tylenol level less than 10.  No detectable amount of Tylenol.  Patient totally medically cleared. ? ?Patient currently voluntary no reason for IVC at this time. ? ? ? ? ?Final Clinical Impression(s) / ED Diagnoses ?Final diagnoses:  ?Suicidal ideation  ? ? ?Rx / DC Orders ?ED  Discharge Orders   ? ? None  ? ?  ? ? ?  ?Vanetta Mulders, MD ?01/18/22 [phone] ? ?  ?Vanetta Mulders, MD ?01/18/22 [phone] ? ?  ?Vanetta Mulders, MD ?01/18/22 1022 ? ?  ?Vanetta Mulders, MD ?01/18/22 1139 ? ?

## 2022-01-18 NOTE — ED Notes (Signed)
Pt states that his dada is coming to get him ,, ?

## 2022-01-18 NOTE — Telephone Encounter (Signed)
Patient is currently admitted in the hospital. Follow up made for 02/09/22 with Jaclyn Shaggy, NP at 1:30 pm. Appointment date will be on discharge paperwork.  ?

## 2022-01-19 ENCOUNTER — Telehealth: Payer: Self-pay

## 2022-01-19 NOTE — Telephone Encounter (Signed)
Attempted f/u call. No answer, left VM. 

## 2022-01-24 ENCOUNTER — Other Ambulatory Visit: Payer: Self-pay

## 2022-01-24 DIAGNOSIS — K2970 Gastritis, unspecified, without bleeding: Secondary | ICD-10-CM

## 2022-01-24 MED ORDER — FAMOTIDINE 20 MG PO TABS: 20.0000 mg | ORAL_TABLET | Freq: Two times a day (BID) | ORAL | 0 refills | Status: DC

## 2022-01-25 NOTE — Telephone Encounter (Signed)
Seems like encounter was open in error so closing encounter.  

## 2022-01-25 NOTE — Telephone Encounter (Signed)
[place]

## 2022-02-08 ENCOUNTER — Telehealth: Payer: Self-pay | Admitting: Gastroenterology

## 2022-02-08 NOTE — Telephone Encounter (Signed)
Patients mother called to let Dr. Orvan Falconer know the patient has checked himself into a mental facility for depression. Also stated he has been doing a lot better with not having to vomit all the time since he is no longer around the stress. ?

## 2022-02-09 ENCOUNTER — Ambulatory Visit: Payer: 59 | Admitting: Nurse Practitioner

## 2022-02-20 ENCOUNTER — Other Ambulatory Visit: Payer: Self-pay | Admitting: Adult Health

## 2022-02-20 ENCOUNTER — Other Ambulatory Visit: Payer: Self-pay | Admitting: Pulmonary Disease

## 2022-02-21 NOTE — Telephone Encounter (Signed)
I must have refilled ritalin as doc of the day a couple months ago. Never seen him. Routing to Dr. Aldean Ast who has prescribed it in past.

## 2022-02-22 ENCOUNTER — Other Ambulatory Visit (HOSPITAL_COMMUNITY): Payer: Self-pay

## 2022-02-22 MED ORDER — METHYLPHENIDATE HCL 10 MG PO TABS
10.0000 mg | ORAL_TABLET | Freq: Two times a day (BID) | ORAL | 0 refills | Status: DC
  Filled 2022-02-22: qty 60, 30d supply, fill #0

## 2022-02-23 NOTE — Telephone Encounter (Signed)
Patient need to schedule an CPE for more refills. Called pt several times no answer and vm is full.  ?

## 2022-03-02 ENCOUNTER — Other Ambulatory Visit: Payer: Self-pay | Admitting: Adult Health

## 2022-03-04 NOTE — Telephone Encounter (Signed)
Okay for refill?  

## 2022-05-18 ENCOUNTER — Encounter: Payer: Self-pay | Admitting: Adult Health

## 2022-05-18 ENCOUNTER — Encounter (HOSPITAL_BASED_OUTPATIENT_CLINIC_OR_DEPARTMENT_OTHER): Payer: Self-pay

## 2022-05-18 ENCOUNTER — Telehealth (INDEPENDENT_AMBULATORY_CARE_PROVIDER_SITE_OTHER): Payer: 59 | Admitting: Adult Health

## 2022-05-18 ENCOUNTER — Other Ambulatory Visit: Payer: Self-pay

## 2022-05-18 ENCOUNTER — Emergency Department (HOSPITAL_BASED_OUTPATIENT_CLINIC_OR_DEPARTMENT_OTHER)
Admission: EM | Admit: 2022-05-18 | Discharge: 2022-05-18 | Disposition: A | Discharge status: Home / Self Care | Payer: 59 | Attending: Emergency Medicine | Admitting: Emergency Medicine

## 2022-05-18 DIAGNOSIS — R112 Nausea with vomiting, unspecified: Secondary | ICD-10-CM

## 2022-05-18 DIAGNOSIS — Z20822 Contact with and (suspected) exposure to covid-19: Secondary | ICD-10-CM | POA: Diagnosis not present

## 2022-05-18 DIAGNOSIS — G43019 Migraine without aura, intractable, without status migrainosus: Secondary | ICD-10-CM | POA: Insufficient documentation

## 2022-05-18 DIAGNOSIS — G43909 Migraine, unspecified, not intractable, without status migrainosus: Secondary | ICD-10-CM

## 2022-05-18 DIAGNOSIS — R519 Headache, unspecified: Secondary | ICD-10-CM | POA: Diagnosis not present

## 2022-05-18 LAB — BASIC METABOLIC PANEL
Anion gap: 9 (ref 5–15)
BUN: 16 mg/dL (ref 6–20)
CO2: 22 mmol/L (ref 22–32)
Calcium: 9.3 mg/dL (ref 8.9–10.3)
Chloride: 108 mmol/L (ref 98–111)
Creatinine, Ser: 1.34 mg/dL — ABNORMAL HIGH (ref 0.61–1.24)
GFR, Estimated: >60 mL/min (ref >60)
Glucose, Bld: 105 mg/dL — ABNORMAL HIGH (ref 70–99)
Potassium: 4.1 mmol/L (ref 3.5–5.1)
Sodium: 139 mmol/L (ref 135–145)

## 2022-05-18 LAB — CBC WITH DIFFERENTIAL/PLATELET
Abs Immature Granulocytes: 0.03 10*3/uL (ref 0.00–0.07)
Basophils Absolute: 0 10*3/uL (ref 0.0–0.1)
Basophils Relative: 1 %
Eosinophils Absolute: 0.1 10*3/uL (ref 0.0–0.5)
Eosinophils Relative: 1 %
HCT: 45.6 % (ref 39.0–52.0)
Hemoglobin: 16.2 g/dL (ref 13.0–17.0)
Immature Granulocytes: 0 %
Lymphocytes Relative: 17 %
Lymphs Abs: 1.5 10*3/uL (ref 0.7–4.0)
MCH: 30.2 pg (ref 26.0–34.0)
MCHC: 35.5 g/dL (ref 30.0–36.0)
MCV: 85.1 fL (ref 80.0–100.0)
Monocytes Absolute: 0.5 10*3/uL (ref 0.1–1.0)
Monocytes Relative: 6 %
Neutro Abs: 6.7 10*3/uL (ref 1.7–7.7)
Neutrophils Relative %: 75 %
Platelets: 331 10*3/uL (ref 150–400)
RBC: 5.36 MIL/uL (ref 4.22–5.81)
RDW: 12.9 % (ref 11.5–15.5)
WBC: 8.9 10*3/uL (ref 4.0–10.5)
nRBC: 0 % (ref 0.0–0.2)

## 2022-05-18 LAB — SARS CORONAVIRUS 2 BY RT PCR: SARS Coronavirus 2 by RT PCR: NEGATIVE

## 2022-05-18 MED ORDER — SODIUM CHLORIDE 0.9 % IV BOLUS
1000.0000 mL | Freq: Once | INTRAVENOUS | Status: AC
  Administered 2022-05-18: 1000 mL via INTRAVENOUS

## 2022-05-18 MED ORDER — ONDANSETRON 8 MG PO TBDP: ORAL_TABLET | ORAL | 0 refills | Status: DC

## 2022-05-18 MED ORDER — SUMATRIPTAN SUCCINATE 25 MG PO TABS: 25.0000 mg | ORAL_TABLET | ORAL | 1 refills | Status: DC | PRN

## 2022-05-18 MED ORDER — MAGNESIUM SULFATE IN D5W 1-5 GM/100ML-% IV SOLN
1.0000 g | Freq: Once | INTRAVENOUS | Status: AC
  Administered 2022-05-18: 1 g via INTRAVENOUS
  Filled 2022-05-18: qty 100

## 2022-05-18 MED ORDER — DEXAMETHASONE SODIUM PHOSPHATE 4 MG/ML IJ SOLN
4.0000 mg | Freq: Once | INTRAMUSCULAR | Status: AC
Start: 2022-05-18 — End: 2022-05-18
  Administered 2022-05-18: 4 mg via INTRAVENOUS
  Filled 2022-05-18: qty 1

## 2022-05-18 MED ORDER — DIPHENHYDRAMINE HCL 50 MG/ML IJ SOLN
50.0000 mg | Freq: Once | INTRAMUSCULAR | Status: AC
Start: 2022-05-18 — End: 2022-05-18
  Administered 2022-05-18: 50 mg via INTRAVENOUS
  Filled 2022-05-18: qty 1

## 2022-05-18 MED ORDER — PROCHLORPERAZINE EDISYLATE 10 MG/2ML IJ SOLN
10.0000 mg | Freq: Once | INTRAMUSCULAR | Status: AC
  Administered 2022-05-18: 10 mg via INTRAVENOUS
  Filled 2022-05-18: qty 2

## 2022-05-18 MED ORDER — METOCLOPRAMIDE HCL 5 MG/ML IJ SOLN
10.0000 mg | Freq: Once | INTRAMUSCULAR | Status: AC
Start: 2022-05-18 — End: 2022-05-18
  Administered 2022-05-18: 10 mg via INTRAVENOUS
  Filled 2022-05-18: qty 2

## 2022-05-18 NOTE — Addendum Note (Signed)
Addended by: Nancy Fetter on: 05/18/2022 12:20 PM   Modules accepted: Orders

## 2022-05-18 NOTE — ED Provider Notes (Signed)
[facility] Provider Note   CSN: [id number] Arrival date & time: 05/18/22  1548     History  Chief Complaint  Patient presents with   Migraine   Emesis    Cory Vazquez is a 31 y.o. male.   Migraine  Emesis   Patient presents due to migraine.  It started 2 days ago, the severity waxes and wanes throughout the day.  Is aggravated by light and associated with nausea and 2 episodes of nonbilious nonbloody emesis.  He denies any abdominal pain, chest pain, shortness of breath, vision changes, neck stiffness, lateralized weakness or numbness.  He is unable to take anti-inflammatories, states he took Tylenol prior to arrival.  He has a history of migraines, states this feels consistent but is last longer than his typical.  Home Medications Prior to Admission medications   Medication Sig Start Date End Date Taking? Authorizing Provider  busPIRone (BUSPAR) 30 MG tablet Take 30 mg by mouth 2 (two) times daily.    [provider]  FLUoxetine (PROZAC) 40 MG capsule Take 40 mg by mouth daily.    [provider]  SUMAtriptan (IMITREX) 25 MG tablet Take 1 tablet (25 mg total) by mouth every 2 (two) hours as needed for migraine. May repeat in 2 hours if headache persists or recurs. 05/18/22   Shirline Frees, NP      Allergies    Nsaids    Review of Systems   Review of Systems  Gastrointestinal:  Positive for vomiting.    Physical Exam Updated Vital Signs BP 133/83   Pulse 70   Temp 97.8 F (36.6 C) (Oral)   Resp 18   Ht 5\' 8"  (1.727 m)   Wt 86.2 kg   SpO2 100%   BMI 28.89 kg/m  Physical Exam Vitals and nursing note reviewed. Exam conducted with a chaperone present.  Constitutional:      Appearance: Normal appearance.  HENT:     Head: Normocephalic and atraumatic.  Eyes:     General: No scleral icterus.       Right eye: No discharge.        Left eye: No discharge.     Extraocular Movements: Extraocular movements intact.      Pupils: Pupils are equal, round, and reactive to light.  Neck:     Comments: No meningismus, no nuchal rigidity Cardiovascular:     Rate and Rhythm: Normal rate and regular rhythm.     Pulses: Normal pulses.     Heart sounds: Normal heart sounds. No murmur heard.    No friction rub. No gallop.  Pulmonary:     Effort: Pulmonary effort is normal. No respiratory distress.     Breath sounds: Normal breath sounds.  Abdominal:     General: Abdomen is flat. Bowel sounds are normal. There is no distension.     Palpations: Abdomen is soft.     Tenderness: There is no abdominal tenderness.  Musculoskeletal:     Cervical back: No rigidity.  Skin:    General: Skin is warm and dry.     Capillary Refill: Capillary refill takes less than 2 seconds.     Coloration: Skin is not jaundiced.  Neurological:     Mental Status: He is alert. Mental status is at baseline.     Coordination: Coordination normal.     Comments: Cranial nerves II to XII are grossly intact.  Grips ankle bilaterally, no pronator drift.  Lower extremity strength equal bilaterally.  Sensation light touch is grossly intact upper and lower extremities.     ED Results / Procedures / Treatments   Labs (all labs ordered are listed, but only abnormal results are displayed) Labs Reviewed  BASIC METABOLIC PANEL - Abnormal; Notable for the following components:      Result Value   Glucose, Bld 105 (*)    Creatinine, Ser 1.34 (*)    All other components within normal limits  SARS CORONAVIRUS 2 BY RT PCR  CBC WITH DIFFERENTIAL/PLATELET    EKG None  Radiology No results found.  Procedures Procedures    Medications Ordered in ED Medications  sodium chloride 0.9 % bolus 1,000 mL (0 mLs Intravenous Stopped 05/18/22 1837)  metoCLOPramide (REGLAN) injection 10 mg (10 mg Intravenous Given 05/18/22 1725)  dexamethasone (DECADRON) injection 4 mg (4 mg Intravenous Given 05/18/22 1722)  diphenhydrAMINE (BENADRYL) injection 50 mg (50  mg Intravenous Given 05/18/22 1723)  prochlorperazine (COMPAZINE) injection 10 mg (10 mg Intravenous Given 05/18/22 1837)  magnesium sulfate IVPB 1 g 100 mL (0 g Intravenous Stopped 05/18/22 1923)    ED Course/ Medical Decision Making/ A&P                           Medical Decision Making Amount and/or Complexity of Data Reviewed Labs: ordered.  Risk Prescription drug management.   Patient presents due to migraine headache.  Differential includes but not limited to migraine, CVA, carotid dissection, SAH, meningitis.  Reviewed external records, patient had a CTA head and neck in October of last year, it was unremarkable.  Seen by telehealth yesterday for headache, also having gastroenteritis type picture.  Prescribe antiemetics.  BP 117/82   Pulse 66   Temp 98 F (36.7 C) (Oral)   Resp 18   Ht 5\' 8"  (1.727 m)   Wt 86.2 kg   SpO2 100%   BMI 28.89 kg/m   On exam patient is neurovascularly intact.  There are no focal deficits, vitals are unremarkable and he is afebrile.  I considered meningitis but does not appear consistent with infectious etiology.  Additionally given the waxing waning nature and lack of acuity I do not think this is consistent with SAH.  There are no focal deficits neurologically and I do not think this is consistent with central nervous process.  Considered imaging of head but do not feel indicated.  I ordered and reviewed laboratory work-up given the emesis.  No leukocytosis or underlying anemia.  COVID-negative.  Mild AKI at 1.34 but no gross electrolyte derangement.  I ordered migraine cocktail including liter fluid bolus, Compazine, mag, Reglan, Benadryl, Decadron.  On reevaluation patient's pain is completely resolved.  Requesting to go home.  I think that is reasonable, work note provided and return precautions discussed.  Discharged in stable condition.        Final Clinical Impression(s) / ED Diagnoses Final diagnoses:  Migraine without status  migrainosus, not intractable, unspecified migraine type    Rx / DC Orders ED Discharge Orders     None         , PA-C 05/18/22 2011    Curatolo, Duquan, DO 05/18/22 2020

## 2022-05-18 NOTE — Progress Notes (Signed)
Virtual Visit via Video Note  I connected with Cory Vazquez on 05/18/22 at  3:30 PM EDT by a video enabled telemedicine application and verified that I am speaking with the correct person using two identifiers.  Location patient: home Location provider:work or home office Persons participating in the virtual visit: patient, provider  I discussed the limitations of evaluation and management by telemedicine and the availability of in person appointments. The patient expressed understanding and agreed to proceed.   HPI: 31 year old male who is being evaluated today for an acute issue.  His symptoms started 2 days ago with hot and cold sweats without fever and frontal headache.  Yesterday he developed nausea, vomiting, diarrhea, and body aches.  His symptoms continued into this morning.  Last threw up roughly an hour ago.  He does have a remote history of migraine headaches, they do not last very long and he can usually sleep through them.  At home he has been using Tylenol and Zofran.  The Zofran has been helping with the nausea and vomiting.  Tylenol has not been helping with his migraine headache.  He is unable to take anti-inflammatories due to allergic reaction.  He is able to keep fluid down.  He has not been eating much due to decreased appetite   ROS: See pertinent positives and negatives per HPI.  Past Medical History:  Diagnosis Date   Allergy    Asthma    Depression    Migraine     Past Surgical History:  Procedure Laterality Date   SEPTOPLASTY     TONSILLECTOMY      Family History  Problem Relation Age of Onset   Depression Mother    Arthritis Maternal Grandmother    Hypertension Maternal Grandmother    Stroke Maternal Grandmother    Hypertension Maternal Grandfather    Diabetes Maternal Grandfather    Arthritis Maternal Grandfather    Cancer Paternal Grandmother    Hearing loss Paternal Grandfather        Current Outpatient Medications:    busPIRone (BUSPAR) 30  MG tablet, Take 30 mg by mouth 2 (two) times daily., Disp: , Rfl:    FLUoxetine (PROZAC) 40 MG capsule, Take 40 mg by mouth daily., Disp: , Rfl:    SUMAtriptan (IMITREX) 25 MG tablet, Take 1 tablet (25 mg total) by mouth every 2 (two) hours as needed for migraine. May repeat in 2 hours if headache persists or recurs., Disp: 10 tablet, Rfl: 1  EXAM:  VITALS per patient if applicable:  GENERAL: alert, oriented, appears well and in no acute distress  HEENT: atraumatic, conjunttiva clear, no obvious abnormalities on inspection of external nose and ears  NECK: normal movements of the head and neck  LUNGS: on inspection no signs of respiratory distress, breathing rate appears normal, no obvious gross SOB, gasping or wheezing  CV: no obvious cyanosis  MS: moves all visible extremities without noticeable abnormality  PSYCH/NEURO: pleasant and cooperative, no obvious depression or anxiety, speech and thought processing grossly intact  ASSESSMENT AND PLAN:  Discussed the following assessment and plan:  1. Acute intractable headache, unspecified headache type - Possible migraine headache. Will send in imitrex.  - SUMAtriptan (IMITREX) 25 MG tablet; Take 1 tablet (25 mg total) by mouth every 2 (two) hours as needed for migraine. May repeat in 2 hours if headache persists or recurs.  Dispense: 10 tablet; Refill: 1  2. Nausea and vomiting, unspecified vomiting type - Possible gastroenteritis. Will send in refill of zofran.  Encouraged fluids and rest.   - ondansetron (ZOFRAN ODT) 8 MG disintegrating tablet; 8mg  ODT q8 hours prn nausea  Dispense: 12 tablet; Refill: 0      I discussed the assessment and treatment plan with the patient. The patient was provided an opportunity to ask questions and all were answered. The patient agreed with the plan and demonstrated an understanding of the instructions.   The patient was advised to call back or seek an in-person evaluation if the symptoms worsen  or if the condition fails to improve as anticipated.   , NP

## 2022-05-18 NOTE — Discharge Instructions (Signed)
You were seen today in the emergency department for migraine headache.  Your laboratory work-up is reassuring, follow-up with your primary the headache returns.  If you have weakness on 1 side of your body, vision changes or unable to eat or drink return to ED.

## 2022-05-18 NOTE — ED Triage Notes (Signed)
Patient c/o right sided migraine x yesterday - states allergic to NSAIDS and tylenol is not helping with the pain.

## 2022-05-18 NOTE — ED Notes (Signed)
Pt c/o migraine x 2 days with no relief + nausea ans vomiting

## 2022-09-21 ENCOUNTER — Ambulatory Visit: Payer: 59 | Admitting: Adult Health

## 2022-10-13 ENCOUNTER — Encounter: Payer: Self-pay | Admitting: Adult Health

## 2022-10-15 ENCOUNTER — Emergency Department (HOSPITAL_BASED_OUTPATIENT_CLINIC_OR_DEPARTMENT_OTHER)
Admission: EM | Admit: 2022-10-15 | Discharge: 2022-10-15 | Disposition: A | Discharge status: Home / Self Care | Payer: 59 | Attending: Emergency Medicine | Admitting: Emergency Medicine

## 2022-10-15 ENCOUNTER — Encounter (HOSPITAL_BASED_OUTPATIENT_CLINIC_OR_DEPARTMENT_OTHER): Payer: Self-pay | Admitting: Emergency Medicine

## 2022-10-15 ENCOUNTER — Other Ambulatory Visit: Payer: Self-pay

## 2022-10-15 DIAGNOSIS — J101 Influenza due to other identified influenza virus with other respiratory manifestations: Secondary | ICD-10-CM | POA: Insufficient documentation

## 2022-10-15 DIAGNOSIS — B349 Viral infection, unspecified: Secondary | ICD-10-CM | POA: Insufficient documentation

## 2022-10-15 DIAGNOSIS — Z1152 Encounter for screening for COVID-19: Secondary | ICD-10-CM | POA: Insufficient documentation

## 2022-10-15 LAB — RESP PANEL BY RT-PCR (RSV, FLU A&B, COVID)  RVPGX2
Influenza A by PCR: POSITIVE — AB
Influenza B by PCR: NEGATIVE
Resp Syncytial Virus by PCR: NEGATIVE
SARS Coronavirus 2 by RT PCR: NEGATIVE

## 2022-10-15 MED ORDER — OSELTAMIVIR PHOSPHATE 75 MG PO CAPS: 75.0000 mg | ORAL_CAPSULE | Freq: Two times a day (BID) | ORAL | 0 refills | Status: DC

## 2022-10-15 MED ORDER — ONDANSETRON HCL 4 MG PO TABS: 4.0000 mg | ORAL_TABLET | Freq: Four times a day (QID) | ORAL | 0 refills | Status: DC | PRN

## 2022-10-15 NOTE — ED Provider Notes (Signed)
[facility] EMERGENCY DEPARTMENT Provider Note   CSN: [id number] Arrival date & time: 10/15/22  1406     History Chief Complaint  Patient presents with   Generalized Body Aches    HPI Cory Vazquez is a 31 y.o. male presenting for fever/cough congestion x 2 days. He has a history of OSA. States that he has had worsening malaise fatigue fevers muscle aches.  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  Tried taking NyQuil but became nauseous.  Requesting symptomatic management for congestion as well as work note to prevent spreading to his employment. Patient's recorded medical, surgical, social, medication list and allergies were reviewed in the Snapshot window as part of the initial history.   Review of Systems   Review of Systems  Constitutional:  Positive for fever. Negative for chills.  HENT:  Positive for congestion. Negative for ear pain and sore throat.   Eyes:  Negative for pain and visual disturbance.  Respiratory:  Positive for cough. Negative for shortness of breath.   Cardiovascular:  Negative for chest pain and palpitations.  Gastrointestinal:  Negative for abdominal pain and vomiting.  Genitourinary:  Negative for dysuria and hematuria.  Musculoskeletal:  Negative for arthralgias and back pain.  Skin:  Negative for color change and rash.  Neurological:  Negative for seizures and syncope.  All other systems reviewed and are negative.   Physical Exam Updated Vital Signs BP 133/86 (BP Location: Right Arm)   Pulse 95   Temp 99 F (37.2 C) (Oral)   Resp 19   Ht 5\' 8"  (1.727 m)   Wt 83.9 kg   SpO2 98%   BMI 28.13 kg/m  Physical Exam Vitals and nursing note reviewed.  Constitutional:      General: He is not in acute distress.    Appearance: He is well-developed.  HENT:     Head: Normocephalic and atraumatic.     Nose: Congestion present.     Mouth/Throat:     Pharynx: No oropharyngeal  exudate or posterior oropharyngeal erythema.  Eyes:     Conjunctiva/sclera: Conjunctivae normal.  Cardiovascular:     Rate and Rhythm: Normal rate and regular rhythm.     Heart sounds: No murmur heard. Pulmonary:     Effort: Pulmonary effort is normal. No respiratory distress.     Breath sounds: Normal breath sounds. No wheezing or rhonchi.  Abdominal:     Palpations: Abdomen is soft.     Tenderness: There is no abdominal tenderness.  Musculoskeletal:        General: No swelling.     Cervical back: Neck supple.  Skin:    General: Skin is warm and dry.     Capillary Refill: Capillary refill takes less than 2 seconds.  Neurological:     Mental Status: He is alert.  Psychiatric:        Mood and Affect: Mood normal.      ED Course/ Medical Decision Making/ A&P    Procedures Procedures   Medications Ordered in ED Medications - No data to display Medical Decision Making:   Tyce Vazquez is a 31 y.o. male who presented to the ED today with subjective fever, cough, congestion detailed above.    Complete initial physical exam performed, notably the patient  was hemodynamically stable in no acute distress.  Posterior oropharynx illuminated and without obvious swelling or deformity.  Patient is without neck stiffness.  Reviewed and confirmed nursing documentation for past medical history, family history, social history.    Initial Assessment:   With the patient's presentation of fever cough congestion, most likely diagnosis is developing viral upper respiratory infection. Other diagnoses were considered including (but not limited to) peritonsillar abscess, retropharyngeal abscess, pneumonia. These are considered less likely due to history of present illness and physical exam findings.   This is most consistent with an acute life/limb threatening illness complicated by underlying chronic conditions. Considered meningitis, however patient's symptoms, vital signs, physical exam  findings including lack of meningismus seem grossly less consistent at this time. Initial Plan:  Viral screening including COVID/flu testing to evaluate for common viral etiologies that need to be tracked Empiric treatment with antipyretics including acetaminophen in ambulatory setting Objective evaluation as below reviewed   Initial Study Results:   Laboratory  All laboratory results reviewed without evidence of clinically relevant pathology.   Flu +   Final Assessment and Plan:   On reassessment, patient is ambulatory tolerating p.o. intake in no acute distress.   Patient is flu positive.  History present on symptoms exam findings are most consistent with this.  Will treat with  Patient is currently stable for outpatient care and management with no indication for hospitalization or transfer at this time.  Discussed all findings with patient expressed understanding.  Disposition:  Based on the above findings, I believe patient is stable for discharge.    Patient/family educated about specific return precautions for given chief complaint and symptoms.  Patient/family educated about follow-up with PCP.     Patient/family expressed understanding of return precautions and need for follow-up. Patient spoken to regarding all imaging and laboratory results and appropriate follow up for these results. All education provided in verbal form with additional information in written form. Time was allowed for answering of patient questions. Patient discharged.    Emergency Department Medication Summary:   Medications - No data to display       Clinical Impression:  1. Viral syndrome      Discharge   Final Clinical Impression(s) / ED Diagnoses Final diagnoses:  Viral syndrome    Rx / DC Orders ED Discharge Orders          Ordered    oseltamivir (TAMIFLU) 75 MG capsule  Every 12 hours        10/15/22 1557    ondansetron (ZOFRAN) 4 MG tablet  Every 6 hours PRN        10/15/22 1557               Glyn Ade, MD 10/15/22 1557

## 2022-10-15 NOTE — ED Triage Notes (Signed)
Pt c/o body aches, loss of taste and smell, cough since Thurs

## 2022-10-16 ENCOUNTER — Telehealth: Payer: Self-pay

## 2023-02-01 IMAGING — CT CT ANGIO NECK
2 of 7 series · 8 of 33 positions shown · IV contrast (Omnipaque)
Comparison: Cervical CT earlier same day.

CLINICAL DATA: Neck trauma, arterial injury suspected. Stabbing
pain of the posterior neck. Fell from a ladder.

EXAM:
CT ANGIOGRAPHY NECK
TECHNIQUE: Multidetector CT imaging of the neck was performed using the
standard protocol during bolus administration of intravenous
contrast. Multiplanar CT image reconstructions and MIPs were
obtained to evaluate the vascular anatomy. Carotid stenosis
measurements (when applicable) are obtained utilizing NASCET
criteria, using the distal internal carotid diameter as the
denominator.
CONTRAST:  75mL OMNIPAQUE IOHEXOL 350 MG/ML SOLN

[Series 5: cta neck · axial · 0.57mm/px · z∈[-218,-124]mm · 2 of 142 slices shown]
[im 48/142  soft-tissue]
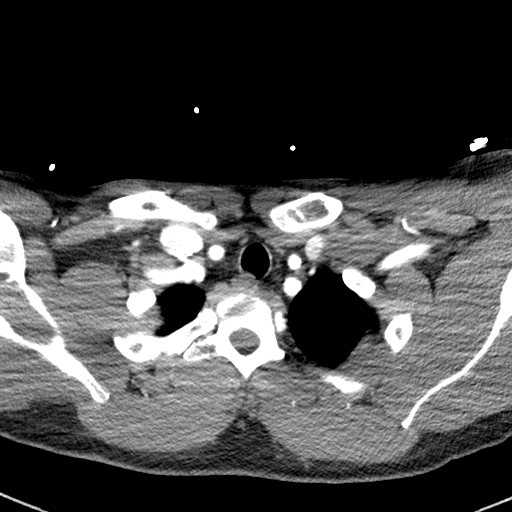
[im 95/142  soft-tissue]
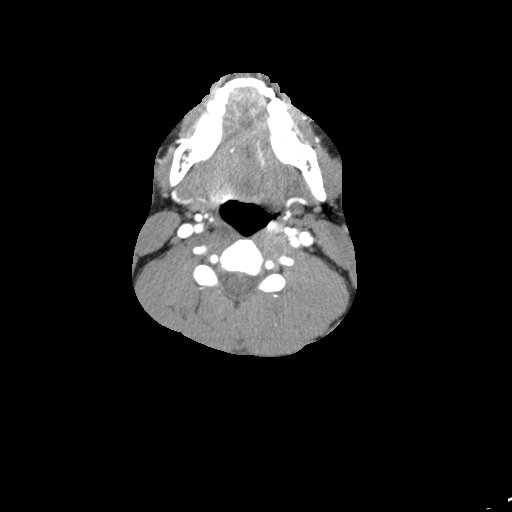

[Series 7: ax thin · axial · 0.48mm/px · z∈[-271,-69]mm · 6 of 284 slices shown]
[im 41/284  soft-tissue]
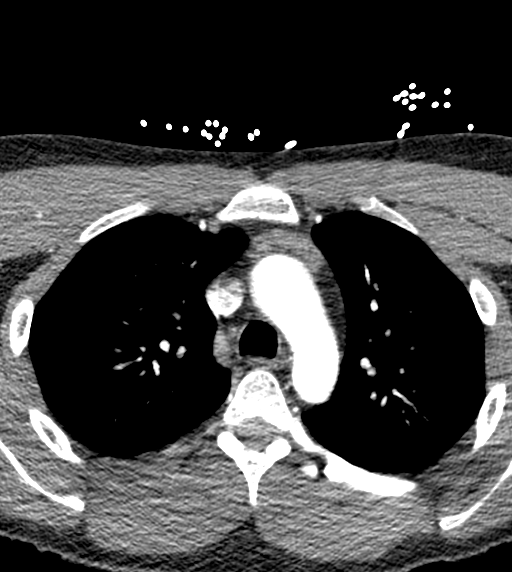
[im 81/284  bone]
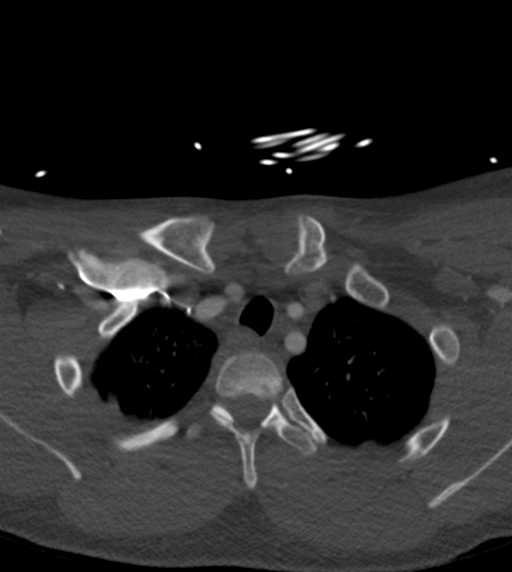
[im 122/284  soft-tissue]
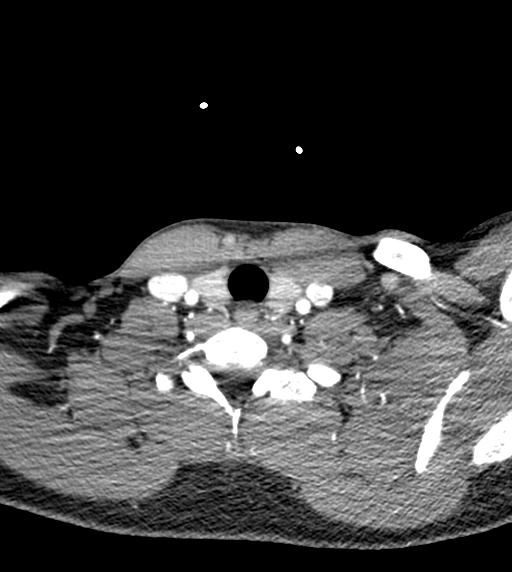
[im 162/284  bone]
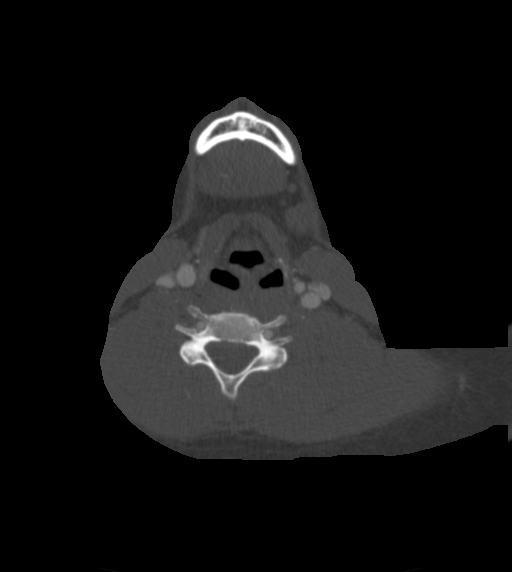
[im 203/284  soft-tissue]
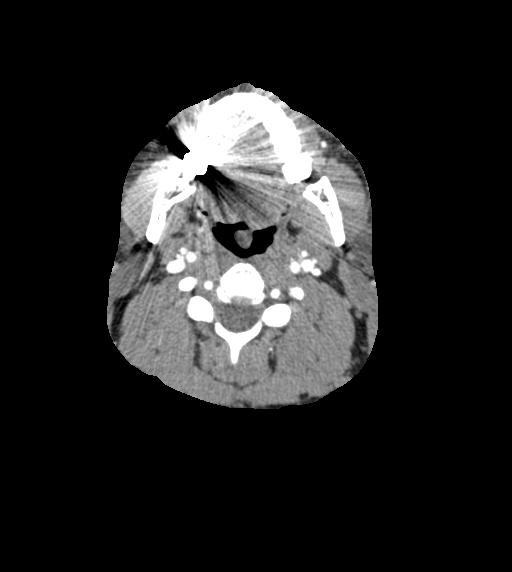
[im 243/284  bone]
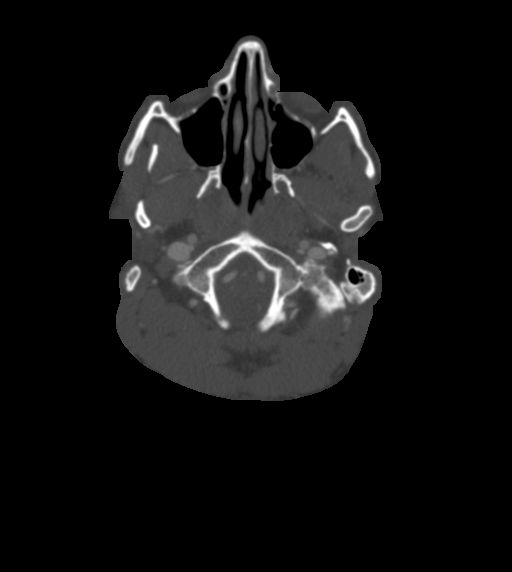

[8 of 33 positions shown; findings below may reference images not displayed]

FINDINGS: Aortic arch: Normal

Right carotid system: Normal. No evidence of dissection or intimal
injury.

Left carotid system: Normal. No evidence of dissection or intimal
injury.

Vertebral arteries: Normal. No evidence of dissection or intimal
injury.

Skeleton: No traumatic finding. No degenerative changes. Minimal
scoliotic curvature.

Other neck: No soft tissue injury identified.

Upper chest: Normal.  No traumatic finding.
IMPRESSION: Normal CT angiography. No vascular pathology identified. No evidence
of regional fracture or soft tissue injury. Mild scoliotic curvature
of the spine.

## 2023-11-07 ENCOUNTER — Telehealth: Payer: Self-pay | Admitting: Adult Health

## 2023-11-07 NOTE — Telephone Encounter (Signed)
Lmom for pt to sch cpe or ov

## 2024-03-13 ENCOUNTER — Ambulatory Visit (INDEPENDENT_AMBULATORY_CARE_PROVIDER_SITE_OTHER): Payer: Self-pay | Admitting: Adult Health

## 2024-03-13 ENCOUNTER — Encounter: Payer: Self-pay | Admitting: Adult Health

## 2024-03-13 VITALS — BP 132/80 | HR 100 | Temp 98.0°F | Ht 68.0 in | Wt 184.0 lb

## 2024-03-13 DIAGNOSIS — S30860A Insect bite (nonvenomous) of lower back and pelvis, initial encounter: Secondary | ICD-10-CM

## 2024-03-13 DIAGNOSIS — W57XXXA Bitten or stung by nonvenomous insect and other nonvenomous arthropods, initial encounter: Secondary | ICD-10-CM

## 2024-03-13 MED ORDER — PREDNISONE 10 MG PO TABS: 10.0000 mg | ORAL_TABLET | Freq: Every day | ORAL | 0 refills | Status: AC

## 2024-03-13 MED ORDER — DOXYCYCLINE HYCLATE 100 MG PO CAPS: 100.0000 mg | ORAL_CAPSULE | Freq: Two times a day (BID) | ORAL | 0 refills | Status: AC

## 2024-03-13 NOTE — Progress Notes (Signed)
 Subjective:    Patient ID: Cory Vazquez, male    DOB: [birth year], 33 y.o.   MRN: [id number]  HPI  33 year old male is being evaluated today for an acute issue.  He reports that about 2 weeks ago he pulled off two ticks from himself.  Later the tick bite was on his left buttocks and the other was on his right upper shoulder.  He has noticed a large bull's-eye rash on his left buttocks.  Rash can be itchy and painful.  Does endorse "waves of feeling sick".  Has had vomiting and loss of appetite with fatigue.  He has not had a rash on his right upper shoulder from that tick bite.  He is unsure of how long the ticks were attached.  He has not had any body aches, fevers, or chills.   Review of Systems See HPI   Past Medical History:  Diagnosis Date   Allergy    Asthma    Depression    Migraine     Social History   Socioeconomic History   Marital status: Married    Spouse name: Not on file   Number of children: 1   Years of education: 12   Highest education level: High school graduate  Occupational History   Not on file  Tobacco Use   Smoking status: Former    Current packs/day: 0.00    Types: E-cigarettes, Cigarettes    Quit date: 06/08/2015    Years since quitting: 8.7   Smokeless tobacco: Never  Vaping Use   Vaping status: Every Day   Substances: CBD  Substance and Sexual Activity   Alcohol use: Not Currently    Alcohol/week: 6.0 standard drinks of alcohol    Types: 6 Shots of liquor per week   Drug use: No   Sexual activity: Yes  Other Topics Concern   Not on file  Social History Narrative   Not on file   Social Drivers of Health   Financial Resource Strain: Not on file  Food Insecurity: Not on file  Transportation Needs: Not on file  Physical Activity: Not on file  Stress: Not on file  Social Connections: Unknown (02/18/2022)   Received from [facility], [facility]   Social Network    Social Network: Not on file  Intimate Partner Violence: Unknown  (01/17/2022)   Received from [facility], [facility]   HITS    Physically Hurt: Not on file    Insult or Talk Down To: Not on file    Threaten Physical Harm: Not on file    Scream or Curse: Not on file    Past Surgical History:  Procedure Laterality Date   SEPTOPLASTY     TONSILLECTOMY      Family History  Problem Relation Age of Onset   Depression Mother    Arthritis Maternal Grandmother    Hypertension Maternal Grandmother    Stroke Maternal Grandmother    Hypertension Maternal Grandfather    Diabetes Maternal Grandfather    Arthritis Maternal Grandfather    Cancer Paternal Grandmother    Hearing loss Paternal Grandfather     Allergies  Allergen Reactions   Nsaids Anaphylaxis    Current Outpatient Medications on File Prior to Visit  Medication Sig Dispense Refill   cetirizine (ZYRTEC) 5 MG chewable tablet Chew 5 mg by mouth daily.     valACYclovir  (VALTREX ) 1000 MG tablet as needed. (Patient not taking: Reported on 03/13/2024)     No current facility-administered  medications on file prior to visit.    BP 132/80   Pulse 100   Temp 98 F (36.7 C) (Oral)   Ht 5\' 8"  (1.727 m)   Wt 184 lb (83.5 kg)   SpO2 97%   BMI 27.98 kg/m        Objective:   Physical Exam Vitals and nursing note reviewed.  Constitutional:      Appearance: Normal appearance.  Cardiovascular:     Rate and Rhythm: Normal rate and regular rhythm.     Pulses: Normal pulses.     Heart sounds: Normal heart sounds.  Pulmonary:     Effort: Pulmonary effort is normal.     Breath sounds: Normal breath sounds.  Skin:    Findings: Rash present.       Neurological:     General: No focal deficit present.     Mental Status: He is alert and oriented to person, place, and time.  Psychiatric:        Mood and Affect: Mood normal.        Behavior: Behavior normal.        Thought Content: Thought content normal.        Judgment: Judgment normal.        Assessment & Plan:  1. Tick bite  of buttock, initial encounter (Primary) - Will check blood work today and treat with doxycycline  100 mg twice daily.  Will send in a few days of prednisone  to help with itchiness.  Due to red flags and advise follow-up if he develops any fevers, chills, body aches. - CBC; Future - B. burgdorfi Antibody; Future - Rocky mtn spotted fvr abs pnl(IgG+IgM); Future - doxycycline  (VIBRAMYCIN ) 100 MG capsule; Take 1 capsule (100 mg total) by mouth 2 (two) times daily.  Dispense: 20 capsule; Refill: 0 - predniSONE  (DELTASONE ) 10 MG tablet; Take 1 tablet (10 mg total) by mouth daily with breakfast.  Dispense: 5 tablet; Refill: 0 - Rocky mtn spotted fvr abs pnl(IgG+IgM) - B. burgdorfi Antibody - CBC  Alto Atta, NP

## 2024-03-14 ENCOUNTER — Ambulatory Visit: Payer: Self-pay | Admitting: Adult Health

## 2024-03-14 LAB — CBC
HCT: 46.5 % (ref 39.0–52.0)
Hemoglobin: 16.3 g/dL (ref 13.0–17.0)
MCHC: 35.1 g/dL (ref 30.0–36.0)
MCV: 84.7 fl (ref 78.0–100.0)
Platelets: 332 10*3/uL (ref 150.0–400.0)
RBC: 5.49 Mil/uL (ref 4.22–5.81)
RDW: 13 % (ref 11.5–15.5)
WBC: 5.4 10*3/uL (ref 4.0–10.5)

## 2024-03-19 LAB — B. BURGDORFI ANTIBODIES: B burgdorferi Ab IgG+IgM: <0.9 {index}

## 2024-03-19 LAB — ROCKY MTN SPOTTED FVR ABS PNL(IGG+IGM)
RMSF IgG: NOT DETECTED
RMSF IgM: NOT DETECTED
